# Patient Record
Sex: Female | Born: 1945 | Race: White | Hispanic: No | Smoking: Never smoker
Health system: Southern US, Community
[De-identification: ages and names within clinical notes are randomized; demographics above are authoritative.]

## PROBLEM LIST (undated history)

## (undated) DIAGNOSIS — M8580 Other specified disorders of bone density and structure, unspecified site: Secondary | ICD-10-CM

## (undated) DIAGNOSIS — Z780 Asymptomatic menopausal state: Secondary | ICD-10-CM

## (undated) DIAGNOSIS — E119 Type 2 diabetes mellitus without complications: Secondary | ICD-10-CM

## (undated) HISTORY — DX: Asymptomatic menopausal state: Z78.0

---

## 2008-04-20 ENCOUNTER — Ambulatory Visit: Payer: Self-pay | Admitting: Urology

## 2008-05-31 ENCOUNTER — Ambulatory Visit: Payer: Self-pay | Admitting: Unknown Physician Specialty

## 2009-02-22 ENCOUNTER — Inpatient Hospital Stay: Payer: Self-pay | Admitting: Unknown Physician Specialty

## 2009-06-14 ENCOUNTER — Ambulatory Visit: Payer: Self-pay | Admitting: Endocrinology

## 2009-11-15 IMAGING — CT CT OF THE RIGHT ANKLE WITHOUT CONTRAST
1 series · 12 of 14 positions shown, 15 images · non-contrast
Comparison: none

REASON FOR EXAM: fracture. with 3d recon
COMMENTS:

[Series 3: axial · axial · 0.39mm/px · z∈[-420,-282]mm · 12 of 56 slices shown, 15 images]
[im 5/56  soft-tissue]
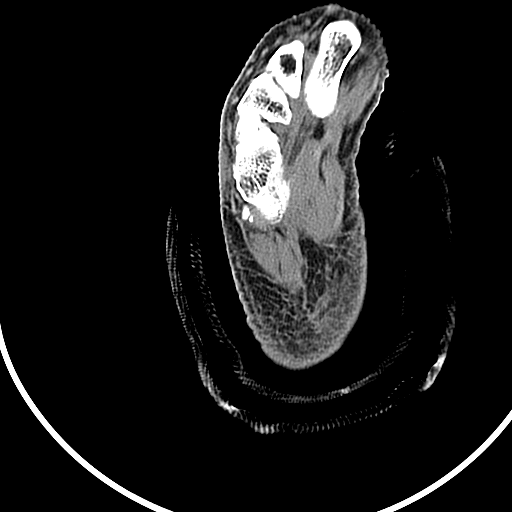
[im 5/56  bone]
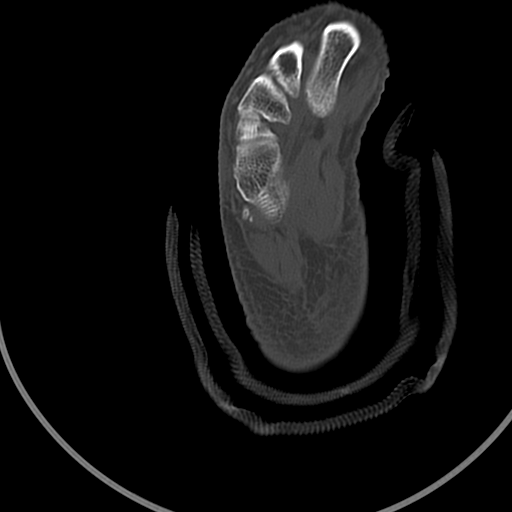
[im 9/56  bone]
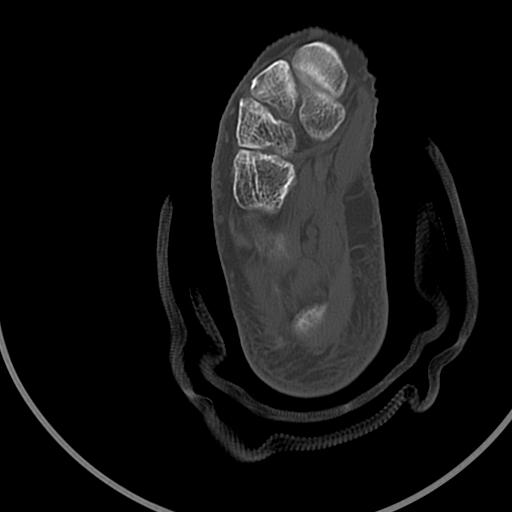
[im 13/56  bone]
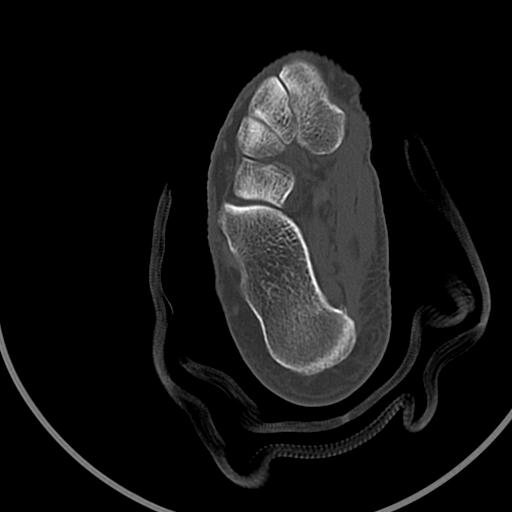
[im 17/56  bone]
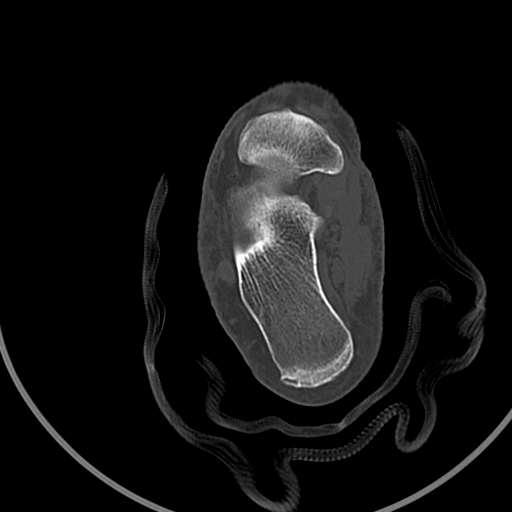
[im 22/56  soft-tissue]
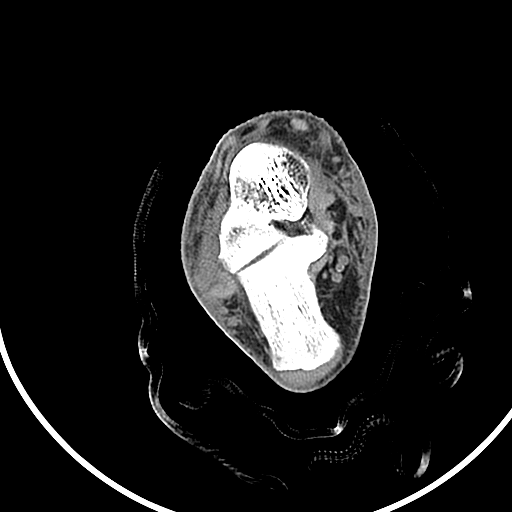
[im 22/56  bone]
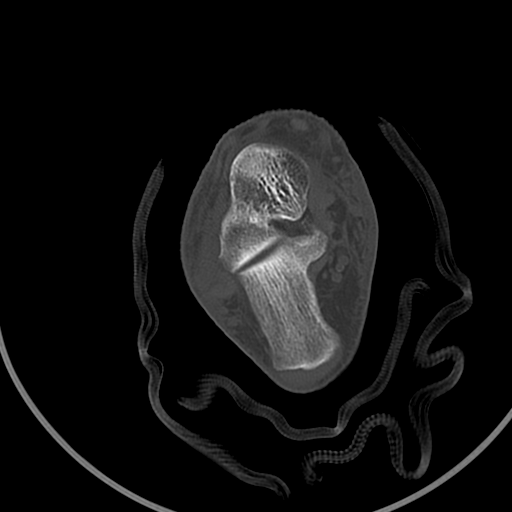
[im 26/56  bone]
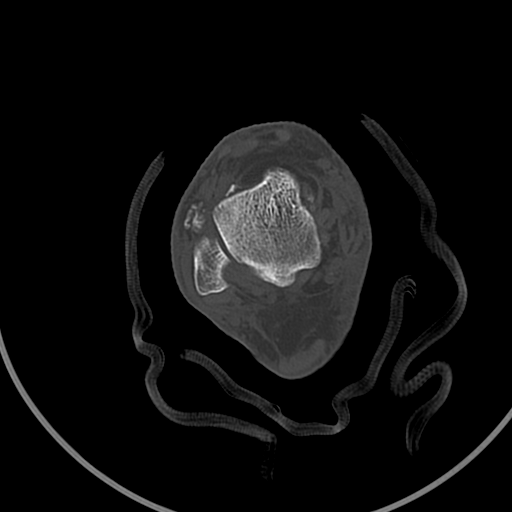
[im 30/56  bone]
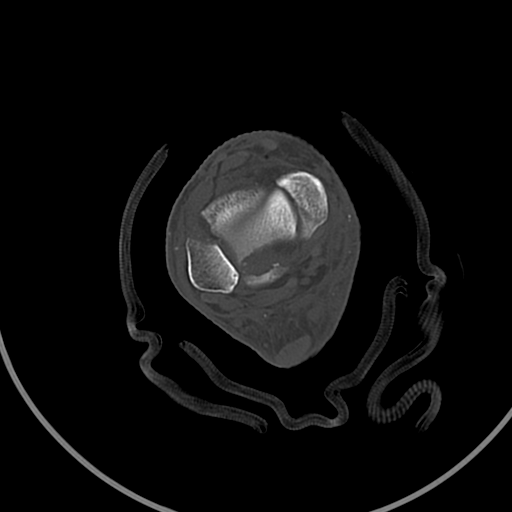
[im 34/56  bone]
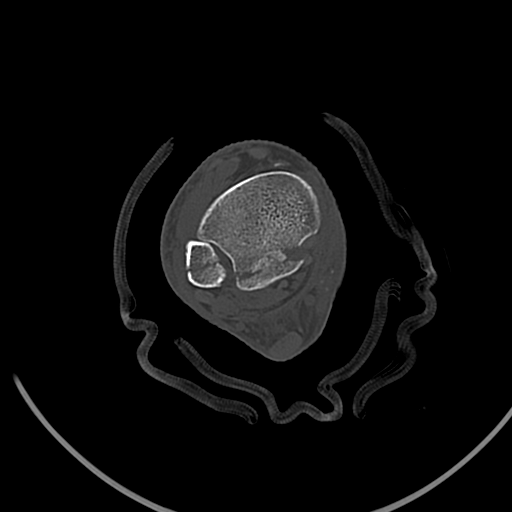
[im 39/56  soft-tissue]
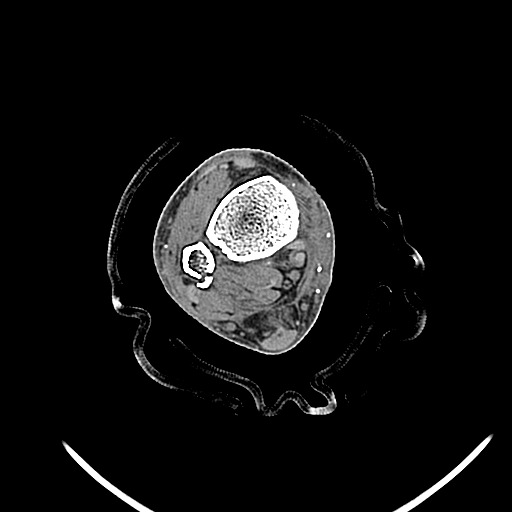
[im 39/56  bone]
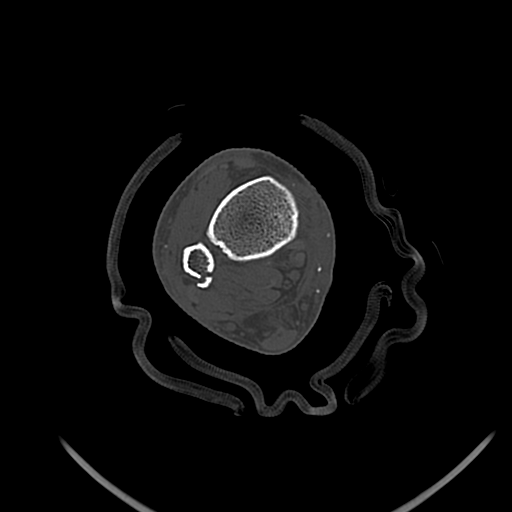
[im 43/56  bone]
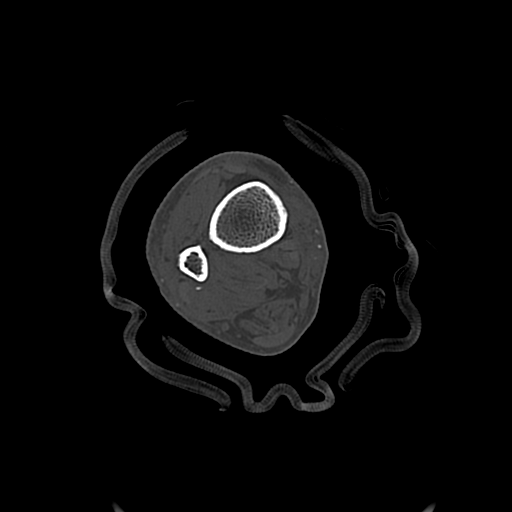
[im 47/56  bone]
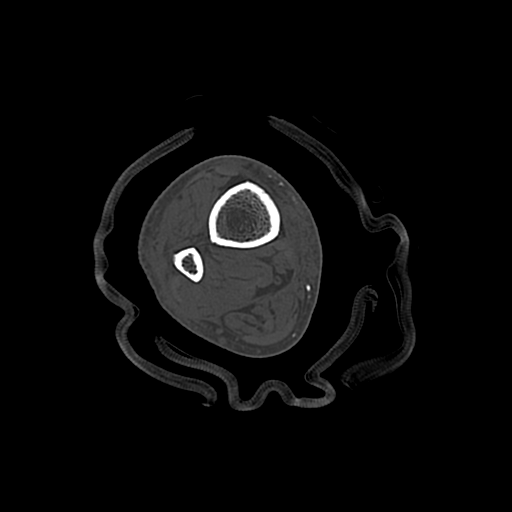
[im 51/56  bone]
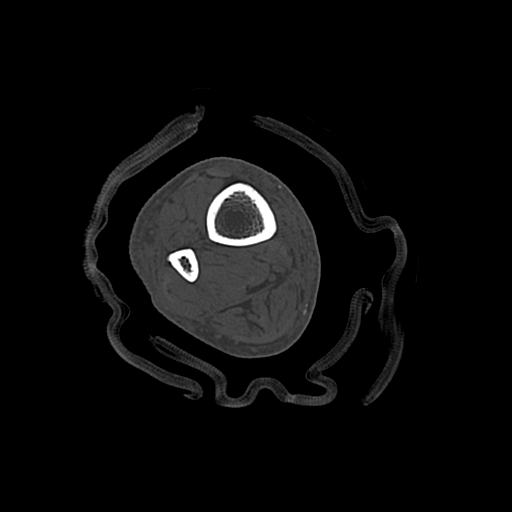

[12 of 14 positions shown; findings below may reference images not displayed]

PROCEDURE:     CT  - CT ANKLE RIGHT WO  - February 22, 2009  [DATE]

RESULT:     Axial, coronal and sagittal imaging of the RIGHT ankle was
obtained as well as 3-D reconstructions. This was correlated with a previous
plain film ankle series dated 02-22-2009.

In the interim there has been realignment of the ankle mortise. A
comminuted, trimalleolar fracture is identified. A focal fragment is
appreciated along the anterior aspect of the lateral malleolus. As stated
above the ankle mortise appears to be intact consistent with realignment
status post initial injury. A small osseous fragment is identified in the
intraarticular location along the lateral aspect of the talar dome and
medial malleolar articulation. This is appreciated on image #33.  A second
small intraarticular fragment is identified along the distal aspect of the
tibiofibular joint posteriorly.
IMPRESSION: 1. Comminuted, trimalleolar fracture as described above.

2. The ankle mortise appears to be intact.

3. Findings concerning for very small areas of interarticular osseous
fragments as described above.

## 2010-02-12 ENCOUNTER — Ambulatory Visit: Payer: Self-pay | Admitting: Endocrinology

## 2014-12-20 DIAGNOSIS — E876 Hypokalemia: Secondary | ICD-10-CM | POA: Diagnosis not present

## 2014-12-20 DIAGNOSIS — E063 Autoimmune thyroiditis: Secondary | ICD-10-CM | POA: Diagnosis not present

## 2014-12-20 DIAGNOSIS — I10 Essential (primary) hypertension: Secondary | ICD-10-CM | POA: Diagnosis not present

## 2014-12-20 DIAGNOSIS — E1169 Type 2 diabetes mellitus with other specified complication: Secondary | ICD-10-CM | POA: Diagnosis not present

## 2014-12-20 DIAGNOSIS — E780 Pure hypercholesterolemia: Secondary | ICD-10-CM | POA: Diagnosis not present

## 2014-12-26 DIAGNOSIS — I10 Essential (primary) hypertension: Secondary | ICD-10-CM | POA: Diagnosis not present

## 2014-12-26 DIAGNOSIS — E1169 Type 2 diabetes mellitus with other specified complication: Secondary | ICD-10-CM | POA: Diagnosis not present

## 2014-12-26 DIAGNOSIS — E039 Hypothyroidism, unspecified: Secondary | ICD-10-CM | POA: Diagnosis not present

## 2015-01-23 DIAGNOSIS — E039 Hypothyroidism, unspecified: Secondary | ICD-10-CM | POA: Diagnosis not present

## 2015-01-23 DIAGNOSIS — I10 Essential (primary) hypertension: Secondary | ICD-10-CM | POA: Diagnosis not present

## 2015-01-23 DIAGNOSIS — M1000 Idiopathic gout, unspecified site: Secondary | ICD-10-CM | POA: Diagnosis not present

## 2015-01-23 DIAGNOSIS — E1169 Type 2 diabetes mellitus with other specified complication: Secondary | ICD-10-CM | POA: Diagnosis not present

## 2015-01-23 DIAGNOSIS — R9431 Abnormal electrocardiogram [ECG] [EKG]: Secondary | ICD-10-CM | POA: Diagnosis not present

## 2015-01-29 DIAGNOSIS — E780 Pure hypercholesterolemia: Secondary | ICD-10-CM | POA: Diagnosis not present

## 2015-01-29 DIAGNOSIS — Z0000 Encounter for general adult medical examination without abnormal findings: Secondary | ICD-10-CM | POA: Diagnosis not present

## 2015-01-29 DIAGNOSIS — Z1211 Encounter for screening for malignant neoplasm of colon: Secondary | ICD-10-CM | POA: Diagnosis not present

## 2015-01-30 DIAGNOSIS — E039 Hypothyroidism, unspecified: Secondary | ICD-10-CM | POA: Diagnosis not present

## 2015-01-30 DIAGNOSIS — I10 Essential (primary) hypertension: Secondary | ICD-10-CM | POA: Diagnosis not present

## 2015-01-30 DIAGNOSIS — E1169 Type 2 diabetes mellitus with other specified complication: Secondary | ICD-10-CM | POA: Diagnosis not present

## 2015-01-30 DIAGNOSIS — R9431 Abnormal electrocardiogram [ECG] [EKG]: Secondary | ICD-10-CM | POA: Diagnosis not present

## 2015-02-19 DIAGNOSIS — I129 Hypertensive chronic kidney disease with stage 1 through stage 4 chronic kidney disease, or unspecified chronic kidney disease: Secondary | ICD-10-CM | POA: Diagnosis not present

## 2015-02-19 DIAGNOSIS — E119 Type 2 diabetes mellitus without complications: Secondary | ICD-10-CM | POA: Diagnosis not present

## 2015-02-19 DIAGNOSIS — N183 Chronic kidney disease, stage 3 (moderate): Secondary | ICD-10-CM | POA: Diagnosis not present

## 2015-02-19 DIAGNOSIS — R312 Other microscopic hematuria: Secondary | ICD-10-CM | POA: Diagnosis not present

## 2015-03-12 DIAGNOSIS — R609 Edema, unspecified: Secondary | ICD-10-CM | POA: Diagnosis not present

## 2015-03-12 DIAGNOSIS — I10 Essential (primary) hypertension: Secondary | ICD-10-CM | POA: Diagnosis not present

## 2015-03-12 DIAGNOSIS — N183 Chronic kidney disease, stage 3 (moderate): Secondary | ICD-10-CM | POA: Diagnosis not present

## 2015-03-12 DIAGNOSIS — E1129 Type 2 diabetes mellitus with other diabetic kidney complication: Secondary | ICD-10-CM | POA: Diagnosis not present

## 2015-03-21 DIAGNOSIS — E039 Hypothyroidism, unspecified: Secondary | ICD-10-CM | POA: Diagnosis not present

## 2015-03-21 DIAGNOSIS — E063 Autoimmune thyroiditis: Secondary | ICD-10-CM | POA: Diagnosis not present

## 2015-03-21 DIAGNOSIS — E1169 Type 2 diabetes mellitus with other specified complication: Secondary | ICD-10-CM | POA: Diagnosis not present

## 2015-03-28 DIAGNOSIS — Z1231 Encounter for screening mammogram for malignant neoplasm of breast: Secondary | ICD-10-CM | POA: Diagnosis not present

## 2015-04-03 DIAGNOSIS — I10 Essential (primary) hypertension: Secondary | ICD-10-CM | POA: Diagnosis not present

## 2015-04-03 DIAGNOSIS — E039 Hypothyroidism, unspecified: Secondary | ICD-10-CM | POA: Diagnosis not present

## 2015-04-03 DIAGNOSIS — E1169 Type 2 diabetes mellitus with other specified complication: Secondary | ICD-10-CM | POA: Diagnosis not present

## 2015-06-27 DIAGNOSIS — E780 Pure hypercholesterolemia: Secondary | ICD-10-CM | POA: Diagnosis not present

## 2015-06-27 DIAGNOSIS — E1169 Type 2 diabetes mellitus with other specified complication: Secondary | ICD-10-CM | POA: Diagnosis not present

## 2015-06-27 DIAGNOSIS — M1000 Idiopathic gout, unspecified site: Secondary | ICD-10-CM | POA: Diagnosis not present

## 2015-06-27 DIAGNOSIS — E039 Hypothyroidism, unspecified: Secondary | ICD-10-CM | POA: Diagnosis not present

## 2015-06-27 DIAGNOSIS — I10 Essential (primary) hypertension: Secondary | ICD-10-CM | POA: Diagnosis not present

## 2015-06-29 DIAGNOSIS — E1169 Type 2 diabetes mellitus with other specified complication: Secondary | ICD-10-CM | POA: Diagnosis not present

## 2015-06-29 DIAGNOSIS — E039 Hypothyroidism, unspecified: Secondary | ICD-10-CM | POA: Diagnosis not present

## 2015-06-29 DIAGNOSIS — I10 Essential (primary) hypertension: Secondary | ICD-10-CM | POA: Diagnosis not present

## 2015-06-29 DIAGNOSIS — M1000 Idiopathic gout, unspecified site: Secondary | ICD-10-CM | POA: Diagnosis not present

## 2015-06-29 DIAGNOSIS — M25562 Pain in left knee: Secondary | ICD-10-CM | POA: Diagnosis not present

## 2015-07-09 DIAGNOSIS — E1169 Type 2 diabetes mellitus with other specified complication: Secondary | ICD-10-CM | POA: Diagnosis not present

## 2015-07-09 DIAGNOSIS — I10 Essential (primary) hypertension: Secondary | ICD-10-CM | POA: Diagnosis not present

## 2015-07-09 DIAGNOSIS — M25462 Effusion, left knee: Secondary | ICD-10-CM | POA: Diagnosis not present

## 2015-07-09 DIAGNOSIS — E780 Pure hypercholesterolemia: Secondary | ICD-10-CM | POA: Diagnosis not present

## 2015-07-26 DIAGNOSIS — E039 Hypothyroidism, unspecified: Secondary | ICD-10-CM | POA: Diagnosis not present

## 2015-07-26 DIAGNOSIS — M25562 Pain in left knee: Secondary | ICD-10-CM | POA: Diagnosis not present

## 2015-07-26 DIAGNOSIS — I10 Essential (primary) hypertension: Secondary | ICD-10-CM | POA: Diagnosis not present

## 2015-09-05 DIAGNOSIS — E039 Hypothyroidism, unspecified: Secondary | ICD-10-CM | POA: Diagnosis not present

## 2015-09-05 DIAGNOSIS — I10 Essential (primary) hypertension: Secondary | ICD-10-CM | POA: Diagnosis not present

## 2015-09-05 DIAGNOSIS — Z23 Encounter for immunization: Secondary | ICD-10-CM | POA: Diagnosis not present

## 2015-09-05 DIAGNOSIS — M25462 Effusion, left knee: Secondary | ICD-10-CM | POA: Diagnosis not present

## 2015-09-05 DIAGNOSIS — E7800 Pure hypercholesterolemia, unspecified: Secondary | ICD-10-CM | POA: Diagnosis not present

## 2015-09-05 DIAGNOSIS — E1169 Type 2 diabetes mellitus with other specified complication: Secondary | ICD-10-CM | POA: Diagnosis not present

## 2015-11-30 DIAGNOSIS — E039 Hypothyroidism, unspecified: Secondary | ICD-10-CM | POA: Diagnosis not present

## 2015-11-30 DIAGNOSIS — J0111 Acute recurrent frontal sinusitis: Secondary | ICD-10-CM | POA: Diagnosis not present

## 2015-11-30 DIAGNOSIS — E1169 Type 2 diabetes mellitus with other specified complication: Secondary | ICD-10-CM | POA: Diagnosis not present

## 2015-11-30 DIAGNOSIS — I10 Essential (primary) hypertension: Secondary | ICD-10-CM | POA: Diagnosis not present

## 2015-12-03 DIAGNOSIS — E1169 Type 2 diabetes mellitus with other specified complication: Secondary | ICD-10-CM | POA: Diagnosis not present

## 2015-12-03 DIAGNOSIS — E039 Hypothyroidism, unspecified: Secondary | ICD-10-CM | POA: Diagnosis not present

## 2015-12-03 DIAGNOSIS — E876 Hypokalemia: Secondary | ICD-10-CM | POA: Diagnosis not present

## 2015-12-03 DIAGNOSIS — E785 Hyperlipidemia, unspecified: Secondary | ICD-10-CM | POA: Diagnosis not present

## 2015-12-06 DIAGNOSIS — I10 Essential (primary) hypertension: Secondary | ICD-10-CM | POA: Diagnosis not present

## 2015-12-06 DIAGNOSIS — E039 Hypothyroidism, unspecified: Secondary | ICD-10-CM | POA: Diagnosis not present

## 2015-12-06 DIAGNOSIS — E1169 Type 2 diabetes mellitus with other specified complication: Secondary | ICD-10-CM | POA: Diagnosis not present

## 2015-12-06 DIAGNOSIS — E063 Autoimmune thyroiditis: Secondary | ICD-10-CM | POA: Diagnosis not present

## 2016-01-16 DIAGNOSIS — H2513 Age-related nuclear cataract, bilateral: Secondary | ICD-10-CM | POA: Diagnosis not present

## 2016-01-16 DIAGNOSIS — E119 Type 2 diabetes mellitus without complications: Secondary | ICD-10-CM | POA: Diagnosis not present

## 2016-01-31 DIAGNOSIS — Z0000 Encounter for general adult medical examination without abnormal findings: Secondary | ICD-10-CM | POA: Diagnosis not present

## 2016-01-31 DIAGNOSIS — Z1211 Encounter for screening for malignant neoplasm of colon: Secondary | ICD-10-CM | POA: Diagnosis not present

## 2016-01-31 DIAGNOSIS — I10 Essential (primary) hypertension: Secondary | ICD-10-CM | POA: Diagnosis not present

## 2016-03-03 DIAGNOSIS — E1169 Type 2 diabetes mellitus with other specified complication: Secondary | ICD-10-CM | POA: Diagnosis not present

## 2016-03-03 DIAGNOSIS — E785 Hyperlipidemia, unspecified: Secondary | ICD-10-CM | POA: Diagnosis not present

## 2016-03-06 DIAGNOSIS — E039 Hypothyroidism, unspecified: Secondary | ICD-10-CM | POA: Diagnosis not present

## 2016-03-06 DIAGNOSIS — M1000 Idiopathic gout, unspecified site: Secondary | ICD-10-CM | POA: Diagnosis not present

## 2016-03-06 DIAGNOSIS — I10 Essential (primary) hypertension: Secondary | ICD-10-CM | POA: Diagnosis not present

## 2016-03-06 DIAGNOSIS — E1165 Type 2 diabetes mellitus with hyperglycemia: Secondary | ICD-10-CM | POA: Diagnosis not present

## 2016-03-18 DIAGNOSIS — R319 Hematuria, unspecified: Secondary | ICD-10-CM | POA: Diagnosis not present

## 2016-03-18 DIAGNOSIS — I10 Essential (primary) hypertension: Secondary | ICD-10-CM | POA: Diagnosis not present

## 2016-03-18 DIAGNOSIS — E1129 Type 2 diabetes mellitus with other diabetic kidney complication: Secondary | ICD-10-CM | POA: Diagnosis not present

## 2016-03-18 DIAGNOSIS — N183 Chronic kidney disease, stage 3 (moderate): Secondary | ICD-10-CM | POA: Diagnosis not present

## 2016-03-24 DIAGNOSIS — E1129 Type 2 diabetes mellitus with other diabetic kidney complication: Secondary | ICD-10-CM | POA: Diagnosis not present

## 2016-03-24 DIAGNOSIS — R600 Localized edema: Secondary | ICD-10-CM | POA: Diagnosis not present

## 2016-03-24 DIAGNOSIS — N183 Chronic kidney disease, stage 3 (moderate): Secondary | ICD-10-CM | POA: Diagnosis not present

## 2016-03-31 DIAGNOSIS — Z1231 Encounter for screening mammogram for malignant neoplasm of breast: Secondary | ICD-10-CM | POA: Diagnosis not present

## 2016-06-06 DIAGNOSIS — E039 Hypothyroidism, unspecified: Secondary | ICD-10-CM | POA: Diagnosis not present

## 2016-06-06 DIAGNOSIS — I10 Essential (primary) hypertension: Secondary | ICD-10-CM | POA: Diagnosis not present

## 2016-06-06 DIAGNOSIS — E1165 Type 2 diabetes mellitus with hyperglycemia: Secondary | ICD-10-CM | POA: Diagnosis not present

## 2016-06-06 DIAGNOSIS — E7800 Pure hypercholesterolemia, unspecified: Secondary | ICD-10-CM | POA: Diagnosis not present

## 2016-06-06 DIAGNOSIS — M1000 Idiopathic gout, unspecified site: Secondary | ICD-10-CM | POA: Diagnosis not present

## 2016-09-08 DIAGNOSIS — Z23 Encounter for immunization: Secondary | ICD-10-CM | POA: Diagnosis not present

## 2016-09-08 DIAGNOSIS — E039 Hypothyroidism, unspecified: Secondary | ICD-10-CM | POA: Diagnosis not present

## 2016-09-08 DIAGNOSIS — E1169 Type 2 diabetes mellitus with other specified complication: Secondary | ICD-10-CM | POA: Diagnosis not present

## 2016-09-08 DIAGNOSIS — M1000 Idiopathic gout, unspecified site: Secondary | ICD-10-CM | POA: Diagnosis not present

## 2016-09-08 DIAGNOSIS — E1165 Type 2 diabetes mellitus with hyperglycemia: Secondary | ICD-10-CM | POA: Diagnosis not present

## 2020-02-24 ENCOUNTER — Other Ambulatory Visit: Payer: Self-pay

## 2020-02-24 ENCOUNTER — Ambulatory Visit: Payer: Medicare Other | Attending: Internal Medicine

## 2020-02-24 DIAGNOSIS — Z23 Encounter for immunization: Secondary | ICD-10-CM

## 2020-02-24 NOTE — Progress Notes (Signed)
   Covid-19 Vaccination Clinic  Name:  Pamela Benson    MRN: [id number] DOB: 1946/07/09  02/24/2020  [name] was observed post Covid-19 immunization for 15 minutes without incident. She was provided with Vaccine Information Sheet and instruction to access the V-Safe system.   Ms. Grams was instructed to call 911 with any severe reactions post vaccine: Marland Kitchen Difficulty breathing  . Swelling of face and throat  . A fast heartbeat  . A bad rash all over body  . Dizziness and weakness   Immunizations Administered    Name Date Dose VIS Date Route   Pfizer COVID-19 Vaccine 02/24/2020 10:54 AM 0.3 mL 10/28/2019 Intramuscular   Manufacturer: [employer], [employer]   Lot: [id number]   NDC: [address]-6

## 2020-03-21 ENCOUNTER — Ambulatory Visit: Payer: Medicare Other | Attending: Internal Medicine

## 2020-03-21 ENCOUNTER — Other Ambulatory Visit: Payer: Self-pay

## 2020-03-21 DIAGNOSIS — Z23 Encounter for immunization: Secondary | ICD-10-CM

## 2020-03-21 NOTE — Progress Notes (Signed)
   Covid-19 Vaccination Clinic  Name:  Pamela Benson    MRN: [id number] DOB: September 05, 1946  03/21/2020  [name] was observed post Covid-19 immunization for 15 minutes without incident. She was provided with Vaccine Information Sheet and instruction to access the V-Safe system.   [name] was instructed to call 911 with any severe reactions post vaccine: Marland Kitchen Difficulty breathing  . Swelling of face and throat  . A fast heartbeat  . A bad rash all over body  . Dizziness and weakness   Immunizations Administered    Name Date Dose VIS Date Route   Pfizer COVID-19 Vaccine 03/21/2020 10:23 AM 0.3 mL 01/11/2019 Intramuscular   Manufacturer: [employer], [employer]   Lot: [id number]   NDC: [address]-7

## 2022-05-13 ENCOUNTER — Encounter: Payer: Self-pay | Admitting: *Deleted

## 2022-05-13 ENCOUNTER — Other Ambulatory Visit: Payer: Self-pay

## 2022-05-13 ENCOUNTER — Emergency Department
Admission: EM | Admit: 2022-05-13 | Discharge: 2022-05-13 | Disposition: A | Discharge status: Home / Self Care | Payer: Medicare Other | Attending: Emergency Medicine | Admitting: Emergency Medicine

## 2022-05-13 ENCOUNTER — Emergency Department: Payer: Medicare Other

## 2022-05-13 DIAGNOSIS — M79672 Pain in left foot: Secondary | ICD-10-CM | POA: Diagnosis present

## 2022-05-13 NOTE — Discharge Instructions (Signed)
You can take naproxen sparingly over the next 2 to 3 days Please keep foot elevated and apply ice at home.

## 2022-05-13 NOTE — ED Provider Notes (Signed)
[facility] Provider Note  Patient Contact: 8:50 PM (approximate)   History   Ankle Pain   HPI  Pamela Benson is a 76 y.o. female presents to the emergency department after patient felt a pop in her left foot while going from a sitting to a standing position.  She denies current pain.  No difficulty ambulating.  No erythema.  No other alleviating measures have been attempted.      Physical Exam   Triage Vital Signs: ED Triage Vitals  Enc Vitals Group     BP 05/13/22 1914 (!) 139/91     Pulse Rate 05/13/22 1914 76     Resp 05/13/22 1914 20     Temp 05/13/22 1914 98.3 F (36.8 C)     Temp Source 05/13/22 1914 Oral     SpO2 05/13/22 1914 97 %     Weight 05/13/22 1912 233 lb (105.7 kg)     Height 05/13/22 1912 5\' 5"  (1.651 m)     Head Circumference --      Peak Flow --      Pain Score 05/13/22 1912 0     Pain Loc --      Pain Edu? --      Excl. in GC? --     Most recent vital signs: Vitals:   05/13/22 1914  BP: (!) 139/91  Pulse: 76  Resp: 20  Temp: 98.3 F (36.8 C)  SpO2: 97%     General: Alert and in no acute distress. Eyes:  PERRL. EOMI. Head: No acute traumatic findings ENT:      Nose: No congestion/rhinnorhea.      Mouth/Throat: Mucous membranes are moist. Neck: No stridor. No cervical spine tenderness to palpation. Cardiovascular:  Good peripheral perfusion Respiratory: Normal respiratory effort without tachypnea or retractions. Lungs CTAB. Good air entry to the bases with no decreased or absent breath sounds. Gastrointestinal: Bowel sounds 4 quadrants. Soft and nontender to palpation. No guarding or rigidity. No palpable masses. No distention. No CVA tenderness. Musculoskeletal: Full range of motion to all extremities.  No tenderness to palpation over the anterior and posterior talofibular ligaments.  Palpable dorsalis pedis pulse, left.  Capillary refill less than 2 seconds on the left. Neurologic:  No gross focal  neurologic deficits are appreciated.  Skin:   No rash noted   ED Results / Procedures / Treatments   Labs (all labs ordered are listed, but only abnormal results are displayed) Labs Reviewed - No data to display    RADIOLOGY  I personally viewed and evaluated these images as part of my medical decision making, as well as reviewing the written report by the radiologist.  ED Provider Interpretation: I personally interpreted x-ray of left foot and there was no acute bony abnormality visualized.   PROCEDURES:  Critical Care performed: No  Procedures   MEDICATIONS ORDERED IN ED: Medications - No data to display   IMPRESSION / MDM / ASSESSMENT AND PLAN / ED COURSE  I reviewed the triage vital signs and the nursing notes.                             Assessment and plan Left foot pain 76 year old female presents to the emergency department after feeling a pop in her left foot.  She reports benign pain there was no acute bony abnormality visualized on her x-ray of her left foot.  Recommended RICE, ice, compression elevation.  Patient was advised to use naproxen sparingly over the next several days after she denied prior history of gastritis or GI bleed.      FINAL CLINICAL IMPRESSION(S) / ED DIAGNOSES   Final diagnoses:  Left foot pain     Rx / DC Orders   ED Discharge Orders     None        Note:  This document was prepared using Dragon voice recognition software and may include unintentional dictation errors.   Pia Mau [place], PA-C 05/13/22 2052    Merwyn Katos, MD 05/13/22 [phone]

## 2023-05-21 ENCOUNTER — Inpatient Hospital Stay
Admission: AD | Admit: 2023-05-21 | Discharge: 2023-05-26 | DRG: 885 | Disposition: A | Discharge status: Home / Self Care | Payer: Medicare Other | Source: Intra-hospital | Attending: Psychiatry | Admitting: Psychiatry

## 2023-05-21 ENCOUNTER — Emergency Department (HOSPITAL_COMMUNITY)
Admission: EM | Admit: 2023-05-21 | Discharge: 2023-05-21 | Disposition: A | Discharge status: Other Acute Inpatient Hospital | Payer: Medicare Other | Source: Home / Self Care | Attending: Student in an Organized Health Care Education/Training Program | Admitting: Student in an Organized Health Care Education/Training Program

## 2023-05-21 ENCOUNTER — Encounter: Payer: Self-pay | Admitting: Psychiatry

## 2023-05-21 ENCOUNTER — Other Ambulatory Visit: Payer: Self-pay

## 2023-05-21 DIAGNOSIS — R45851 Suicidal ideations: Secondary | ICD-10-CM | POA: Diagnosis present

## 2023-05-21 DIAGNOSIS — Z79899 Other long term (current) drug therapy: Secondary | ICD-10-CM

## 2023-05-21 DIAGNOSIS — R45850 Homicidal ideations: Secondary | ICD-10-CM | POA: Diagnosis present

## 2023-05-21 DIAGNOSIS — Z20822 Contact with and (suspected) exposure to covid-19: Secondary | ICD-10-CM | POA: Insufficient documentation

## 2023-05-21 DIAGNOSIS — F419 Anxiety disorder, unspecified: Secondary | ICD-10-CM | POA: Diagnosis present

## 2023-05-21 DIAGNOSIS — Z79890 Hormone replacement therapy (postmenopausal): Secondary | ICD-10-CM | POA: Diagnosis not present

## 2023-05-21 DIAGNOSIS — F23 Brief psychotic disorder: Principal | ICD-10-CM | POA: Diagnosis present

## 2023-05-21 DIAGNOSIS — Z733 Stress, not elsewhere classified: Secondary | ICD-10-CM | POA: Insufficient documentation

## 2023-05-21 DIAGNOSIS — F29 Unspecified psychosis not due to a substance or known physiological condition: Secondary | ICD-10-CM | POA: Insufficient documentation

## 2023-05-21 DIAGNOSIS — Z7983 Long term (current) use of bisphosphonates: Secondary | ICD-10-CM | POA: Diagnosis not present

## 2023-05-21 DIAGNOSIS — F22 Delusional disorders: Secondary | ICD-10-CM | POA: Insufficient documentation

## 2023-05-21 LAB — CBC WITH DIFFERENTIAL/PLATELET
Abs Immature Granulocytes: 0.04 10*3/uL (ref 0.00–0.07)
Basophils Absolute: 0.1 10*3/uL (ref 0.0–0.1)
Basophils Relative: 1 %
Eosinophils Absolute: 0.1 10*3/uL (ref 0.0–0.5)
Eosinophils Relative: 1 %
HCT: 38.3 % (ref 36.0–46.0)
Hemoglobin: 12 g/dL (ref 12.0–15.0)
Immature Granulocytes: 0 %
Lymphocytes Relative: 14 %
Lymphs Abs: 1.4 10*3/uL (ref 0.7–4.0)
MCH: 28.1 pg (ref 26.0–34.0)
MCHC: 31.3 g/dL (ref 30.0–36.0)
MCV: 89.7 fL (ref 80.0–100.0)
Monocytes Absolute: 0.6 10*3/uL (ref 0.1–1.0)
Monocytes Relative: 6 %
Neutro Abs: 7.6 10*3/uL (ref 1.7–7.7)
Neutrophils Relative %: 78 %
Platelets: 290 10*3/uL (ref 150–400)
RBC: 4.27 MIL/uL (ref 3.87–5.11)
RDW: 15.7 % — ABNORMAL HIGH (ref 11.5–15.5)
WBC: 9.8 10*3/uL (ref 4.0–10.5)
nRBC: 0 % (ref 0.0–0.2)

## 2023-05-21 LAB — BASIC METABOLIC PANEL
Anion gap: 8 (ref 5–15)
BUN: 19 mg/dL (ref 8–23)
CO2: 27 mmol/L (ref 22–32)
Calcium: 9.3 mg/dL (ref 8.9–10.3)
Chloride: 102 mmol/L (ref 98–111)
Creatinine, Ser: 1.2 mg/dL — ABNORMAL HIGH (ref 0.44–1.00)
GFR, Estimated: 47 mL/min — ABNORMAL LOW (ref >60)
Glucose, Bld: 158 mg/dL — ABNORMAL HIGH (ref 70–99)
Potassium: 3.7 mmol/L (ref 3.5–5.1)
Sodium: 137 mmol/L (ref 135–145)

## 2023-05-21 LAB — URINALYSIS, ROUTINE W REFLEX MICROSCOPIC
Bilirubin Urine: NEGATIVE
Glucose, UA: 50 mg/dL — AB
Ketones, ur: NEGATIVE mg/dL
Nitrite: NEGATIVE
Protein, ur: NEGATIVE mg/dL
Specific Gravity, Urine: 1.015 (ref 1.005–1.030)
pH: 6 (ref 5.0–8.0)

## 2023-05-21 LAB — SARS CORONAVIRUS 2 BY RT PCR: SARS Coronavirus 2 by RT PCR: NEGATIVE

## 2023-05-21 MED ORDER — OLANZAPINE 10 MG IM SOLR: 5.0000 mg | Freq: Every day | INTRAMUSCULAR | Status: DC | PRN

## 2023-05-21 MED ORDER — RISPERIDONE 1 MG PO TABS
0.5000 mg | ORAL_TABLET | Freq: Two times a day (BID) | ORAL | Status: DC
  Administered 2023-05-21 – 2023-05-26 (×10): 0.5 mg via ORAL
  Filled 2023-05-21 (×11): qty 1

## 2023-05-21 MED ORDER — ALUM & MAG HYDROXIDE-SIMETH 200-200-20 MG/5ML PO SUSP: 30.0000 mL | ORAL | Status: DC | PRN

## 2023-05-21 MED ORDER — OLANZAPINE 5 MG PO TABS: 5.0000 mg | ORAL_TABLET | Freq: Every day | ORAL | Status: DC | PRN

## 2023-05-21 MED ORDER — CEPHALEXIN 500 MG PO CAPS
500.0000 mg | ORAL_CAPSULE | Freq: Once | ORAL | Status: AC
  Administered 2023-05-21: 500 mg via ORAL
  Filled 2023-05-21: qty 1

## 2023-05-21 MED ORDER — MAGNESIUM HYDROXIDE 400 MG/5ML PO SUSP: 30.0000 mL | Freq: Every day | ORAL | Status: DC | PRN

## 2023-05-21 MED ORDER — ACETAMINOPHEN 325 MG PO TABS: 650.0000 mg | ORAL_TABLET | Freq: Four times a day (QID) | ORAL | Status: DC | PRN

## 2023-05-21 MED ORDER — RISPERIDONE 1 MG PO TABS
0.5000 mg | ORAL_TABLET | Freq: Two times a day (BID) | ORAL | Status: DC
  Administered 2023-05-21: 0.5 mg via ORAL
  Filled 2023-05-21: qty 1

## 2023-05-21 NOTE — ED Provider Notes (Signed)
[facility] Provider Note    Event Date/Time   First MD Initiated Contact with Patient 05/21/23 1235     (approximate)   History   Stress and Hallucinations (Patient is here today with her sister with concerns over stress and anxiety related to problems in her neighborhood; She states that she has not slept well in "months" and sleeps at most 4 hours a night; She also reports AH of people chanting and someone saying "I'm gonna kill you."; She denies SI/HI; She was recently started on Buspar by her PCP but has had no relief with it)   HPI  Pamela Benson is a 77 y.o. female no significant past medical history presents to the ER for evaluation of increasing anxiety and stress and reporting having auditory hallucinations over the past several weeks where she feels like someone is walking around her house saying "I will kill you.  "She is called police multiple times.  Family have stated her house to ensure that there are no people that could be sameness or making noises and that has been confirmed.  She was recently prescribed buspirone 10 mg twice daily by her PCP and she just started taking this.  Denies any other medications.  No history of dementia.     Physical Exam   Triage Vital Signs: ED Triage Vitals  Enc Vitals Group     BP 05/21/23 1153 (!) 144/89     Pulse Rate 05/21/23 1153 70     Resp 05/21/23 1153 19     Temp 05/21/23 1153 98.6 F (37 C)     Temp Source 05/21/23 1153 Oral     SpO2 05/21/23 1153 98 %     Weight 05/21/23 1150 237 lb (107.5 kg)     Height 05/21/23 1150 5\' 4"  (1.626 m)     Head Circumference --      Peak Flow --      Pain Score 05/21/23 1151 0     Pain Loc --      Pain Edu? --      Excl. in GC? --     Most recent vital signs: Vitals:   05/21/23 1153  BP: (!) 144/89  Pulse: 70  Resp: 19  Temp: 98.6 F (37 C)  SpO2: 98%     Constitutional: Alert  Eyes: Conjunctivae are normal.  Head: Atraumatic. Nose: No  congestion/rhinnorhea. Mouth/Throat: Mucous membranes are moist.   Neck: Painless ROM.  Cardiovascular:   Good peripheral circulation. Respiratory: Normal respiratory effort.  No retractions.  Gastrointestinal: Soft and nontender.  Musculoskeletal:  no deformity Neurologic:  MAE spontaneously. No gross focal neurologic deficits are appreciated.  Skin:  Skin is warm, dry and intact. No rash noted. Psychiatric: Mood and affect are normal. Speech and behavior are normal.    ED Results / Procedures / Treatments   Labs (all labs ordered are listed, but only abnormal results are displayed) Labs Reviewed  CBC WITH DIFFERENTIAL/PLATELET - Abnormal; Notable for the following components:      Result Value   RDW 15.7 (*)    All other components within normal limits  BASIC METABOLIC PANEL - Abnormal; Notable for the following components:   Glucose, Bld 158 (*)    Creatinine, Ser 1.20 (*)    GFR, Estimated 47 (*)    All other components within normal limits  URINALYSIS, ROUTINE W REFLEX MICROSCOPIC - Abnormal; Notable for the following components:   Color, Urine YELLOW (*)  APPearance HAZY (*)    Glucose, UA 50 (*)    Hgb urine dipstick SMALL (*)    Leukocytes,Ua SMALL (*)    Bacteria, UA MANY (*)    All other components within normal limits     EKG     RADIOLOGY    PROCEDURES:  Critical Care performed:   Procedures   MEDICATIONS ORDERED IN ED: Medications  risperiDONE (RISPERDAL) tablet 0.5 mg (has no administration in time range)  OLANZapine (ZYPREXA) tablet 5 mg (has no administration in time range)    Or  OLANZapine (ZYPREXA) injection 5 mg (has no administration in time range)  cephALEXin (KEFLEX) capsule 500 mg (500 mg Oral Given 05/21/23 1346)     IMPRESSION / MDM / ASSESSMENT AND PLAN / ED COURSE  I reviewed the triage vital signs and the nursing notes.                              Differential diagnosis includes, but is not limited to, Psychosis,  delirium, medication effect, noncompliance, polysubstance abuse, Si, Hi, depression  Patient presenting to the ER for evaluation of symptoms as described above.  Based on symptoms, risk factors and considered above differential, this presenting complaint could reflect a potentially life-threatening illness therefore the patient will be placed on continuous pulse oximetry and telemetry for monitoring.  Laboratory evaluation will be sent to evaluate for the above complaints.  Patient does have evidence of UTI but exhibiting findings of hallucinations and paranoia.  Psychiatry was consulted.  They are recommending Geri psychiatric admission.  Patient and family agreeable to plan.       FINAL CLINICAL IMPRESSION(S) / ED DIAGNOSES   Final diagnoses:  Paranoia (HCC)     Rx / DC Orders   ED Discharge Orders     None        Note:  This document was prepared using Dragon voice recognition software and may include unintentional dictation errors.    Willy Eddy, MD 05/21/23 [phone]

## 2023-05-21 NOTE — ED Notes (Signed)
Called report to [facility] Fuse to give report on patient at this time to Althea Charon, RN

## 2023-05-21 NOTE — ED Notes (Addendum)
Patient being dressed out at this time by this RN and Cailtin, NT.  Patient Belongings:  1 brown purse 1 cell phone 1 phone charger 1 grey ring 1 grey watch 2 grey earrings 1 multicolored shirt 1 pair of blue shorts 2 white socks 2 black shoes 1 white bra 1 pink cup 1 purple pair of underwear

## 2023-05-21 NOTE — Group Note (Unsigned)
Date:  05/21/2023 Time:  10:52 PM  Group Topic/Focus:  Crisis Planning:   The purpose of this group is to help patients create a crisis plan for use upon discharge or in the future, as needed. Goals Group:   The focus of this group is to help patients establish daily goals to achieve during treatment and discuss how the patient can incorporate goal setting into their daily lives to aide in recovery.     Participation Level:  {BHH PARTICIPATION ZOXWR:60454}  Participation Quality:  {BHH PARTICIPATION QUALITY:22265}  Affect:  {BHH AFFECT:22266}  Cognitive:  {BHH COGNITIVE:22267}  Insight: {BHH Insight2:20797}  Engagement in Group:  {BHH ENGAGEMENT IN UJWJX:91478}  Modes of Intervention:  {BHH MODES OF INTERVENTION:22269}  Additional Comments:  ***  Pamela Benson 05/21/2023, 10:52 PM

## 2023-05-21 NOTE — ED Triage Notes (Signed)
Patient is here today with her sister with concerns over stress and anxiety related to problems in her neighborhood; She states that she has not slept well in "months" and sleeps at most 4 hours a night; She also reports AH of people chanting and someone saying "I'm gonna kill you."; She denies SI/HI; She was recently started on Buspar by her PCP but has had no relief with it

## 2023-05-21 NOTE — ED Notes (Signed)
Clide Cliff, RN to give results of covid test.

## 2023-05-21 NOTE — ED Notes (Signed)
Report given to Hannah RN at this time.

## 2023-05-21 NOTE — Consult Note (Signed)
[facility] Face-to-Face Psychiatry Consult   Reason for Consult:  auditory hallucinations Referring Physician:  EDP Patient Identification: Pamela Benson MRN:  [id number] Principal Diagnosis: Acute psychosis [facility]) Diagnosis:  Principal Problem:   Acute psychosis (HCC)   Total Time spent with patient: 45 minutes  Subjective:   Pamela Benson is a 77 y.o. female patient admitted with psychosis.  HPI:   77 yo female presented to the ED for auditory hallucinations and delusions, no psych history.  On assessment, she requested her sister stay "we share everything" who interjected additional findings.  The client reported, "I've had a lot of stress and aggravation and a lot of things other people don't."  Since January, she started hearing people telling her they are after her.  Sometimes she hears a lady outside her door that sings a song about how she is after her.  She stated they come in her yard and hide in her woods, never sees them. These voices occur throughout the day, increase at night.  She fears "someone is going to kill me" all the time. This has resulted in her calling the police 73 times about this concerns.  Her nephew, a retired Emergency planning/management officer, placed cameras around her home that monitor activity and has shown no abnormal activities. Her sleep initiation is "good" but maintenance is poor related to the fear, anxiety, and voices.  Appetite is "good, I cook and everything".  She has lived in her home for 21 years and nothing like this has ever happened.  Her sister confirms the history above and how the client has had to come to her house in the middle of the night for fear of being killed related to the auditory hallucinations.  No alcohol or substance use, no past history of psych issues.  Discussed inpatient gero for stabilization, client is agreeable along with started Risperdal to assist her symptoms.  Explained the side effects and the purpose of the medicaiton.  Past Psychiatric  History: none  Risk to Self:  none Risk to Others:  none Prior Inpatient Therapy:  none Prior Outpatient Therapy:  none  Past Medical History: prediabetic Family History: No family history on file. Family Psychiatric  History: none Social History:  Social History   Substance and Sexual Activity  Alcohol Use Not Currently     Social History   Substance and Sexual Activity  Drug Use Not on file    Social History   Socioeconomic History   Marital status: Married    Spouse name: Not on file   Number of children: Not on file   Years of education: Not on file   Highest education level: Not on file  Occupational History   Not on file  Tobacco Use   Smoking status: Never   Smokeless tobacco: Never  Substance and Sexual Activity   Alcohol use: Not Currently   Drug use: Not on file   Sexual activity: Not on file  Other Topics Concern   Not on file  Social History Narrative   Not on file   Social Determinants of Health   Financial Resource Strain: Not on file  Food Insecurity: Not on file  Transportation Needs: Not on file  Physical Activity: Not on file  Stress: Not on file  Social Connections: Not on file   Additional Social History:    Allergies:  No Known Allergies  Labs:  Results for orders placed or performed during the hospital encounter of 05/21/23 (from the past 48 hour(s))  CBC with Differential     Status: Abnormal   Collection Time: 05/21/23 11:52 AM  Result Value Ref Range   WBC 9.8 4.0 - 10.5 K/uL   RBC 4.27 3.87 - 5.11 MIL/uL   Hemoglobin 12.0 12.0 - 15.0 g/dL   HCT 40.9 81.1 - 91.4 %   MCV 89.7 80.0 - 100.0 fL   MCH 28.1 26.0 - 34.0 pg   MCHC 31.3 30.0 - 36.0 g/dL   RDW 78.2 (H) 95.6 - 21.3 %   Platelets 290 150 - 400 K/uL   nRBC 0.0 0.0 - 0.2 %   Neutrophils Relative % 78 %   Neutro Abs 7.6 1.7 - 7.7 K/uL   Lymphocytes Relative 14 %   Lymphs Abs 1.4 0.7 - 4.0 K/uL   Monocytes Relative 6 %   Monocytes Absolute 0.6 0.1 - 1.0 K/uL    Eosinophils Relative 1 %   Eosinophils Absolute 0.1 0.0 - 0.5 K/uL   Basophils Relative 1 %   Basophils Absolute 0.1 0.0 - 0.1 K/uL   Immature Granulocytes 0 %   Abs Immature Granulocytes 0.04 0.00 - 0.07 K/uL    Comment: Performed at [facility], [address] [place], [place] [address]  Basic metabolic panel     Status: Abnormal   Collection Time: 05/21/23 11:52 AM  Result Value Ref Range   Sodium 137 135 - 145 mmol/L   Potassium 3.7 3.5 - 5.1 mmol/L   Chloride 102 98 - 111 mmol/L   CO2 27 22 - 32 mmol/L   Glucose, Bld 158 (H) 70 - 99 mg/dL    Comment: Glucose reference range applies only to samples taken after fasting for at least 8 hours.   BUN 19 8 - 23 mg/dL   Creatinine, Ser 8.46 (H) 0.44 - 1.00 mg/dL   Calcium 9.3 8.9 - 96.2 mg/dL   GFR, Estimated 47 (L) >60 mL/min    Comment: (NOTE) Calculated using the CKD-EPI Creatinine Equation (2021)    Anion gap 8 5 - 15    Comment: Performed at [facility], [address] [place], [place] [address]  Urinalysis, Routine w reflex microscopic -Urine, Clean Catch     Status: Abnormal   Collection Time: 05/21/23 11:52 AM  Result Value Ref Range   Color, Urine YELLOW (A) YELLOW   APPearance HAZY (A) CLEAR   Specific Gravity, Urine 1.015 1.005 - 1.030   pH 6.0 5.0 - 8.0   Glucose, UA 50 (A) NEGATIVE mg/dL   Hgb urine dipstick SMALL (A) NEGATIVE   Bilirubin Urine NEGATIVE NEGATIVE   Ketones, ur NEGATIVE NEGATIVE mg/dL   Protein, ur NEGATIVE NEGATIVE mg/dL   Nitrite NEGATIVE NEGATIVE   Leukocytes,Ua SMALL (A) NEGATIVE   RBC / HPF 6-10 0 - 5 RBC/hpf   WBC, UA 11-20 0 - 5 WBC/hpf   Bacteria, UA MANY (A) NONE SEEN   Squamous Epithelial / HPF 6-10 0 - 5 /HPF   Mucus PRESENT     Comment: Performed at [facility], [address] [place], [place] [address]    No current facility-administered medications for this encounter.   No current outpatient medications on file.    Musculoskeletal: Strength  & Muscle Tone: within normal limits Gait & Station: normal Patient leans: N/A  Psychiatric Specialty Exam: Physical Exam Vitals and nursing note reviewed.  Constitutional:      Appearance: Normal appearance.  HENT:     Head: Normocephalic.     Nose: Nose normal.  Pulmonary:     Effort: Pulmonary effort is normal.  Musculoskeletal:        General: Normal range of motion.     Cervical back: Normal range of motion.  Neurological:     General: No focal deficit present.     Mental Status: She is alert and oriented to person, place, and time.  Psychiatric:        Attention and Perception: Attention normal. She perceives auditory hallucinations.        Mood and Affect: Mood is anxious.        Speech: Speech normal.        Behavior: Behavior normal. Behavior is cooperative.        Thought Content: Thought content is delusional.        Cognition and Memory: Cognition and memory normal.        Judgment: Judgment normal.     Review of Systems  Psychiatric/Behavioral:  Positive for hallucinations. The patient is nervous/anxious.   All other systems reviewed and are negative.   Blood pressure (!) 144/89, pulse 70, temperature 98.6 F (37 C), temperature source Oral, resp. rate 19, height 5\' 4"  (1.626 m), weight 107.5 kg, SpO2 98 %.Body mass index is 40.68 kg/m.  General Appearance: Casual  Eye Contact:  Fair  Speech:  Normal Rate  Volume:  Normal  Mood:  Anxious  Affect:  Congruent  Thought Process:  Coherent  Orientation:  Full (Time, Place, and Person)  Thought Content:  Delusions, Hallucinations: Auditory, and Paranoid Ideation  Suicidal Thoughts:  No  Homicidal Thoughts:  No  Memory:  Immediate;   Good Recent;   Good Remote;   Good  Judgement:  Fair  Insight:  Good  Psychomotor Activity:  Normal  Concentration:  Concentration: Fair and Attention Span: Fair  Recall:  Good  Fund of Knowledge:  Good  Language:  Good  Akathisia:  No  Handed:  Right  AIMS (if indicated):      Assets:  Housing Leisure Time Physical Health Resilience Social Support  ADL's:  Intact  Cognition:  WNL  Sleep:        Physical Exam: Physical Exam Vitals and nursing note reviewed.  Constitutional:      Appearance: Normal appearance.  HENT:     Head: Normocephalic.     Nose: Nose normal.  Pulmonary:     Effort: Pulmonary effort is normal.  Musculoskeletal:        General: Normal range of motion.     Cervical back: Normal range of motion.  Neurological:     General: No focal deficit present.     Mental Status: She is alert and oriented to person, place, and time.  Psychiatric:        Attention and Perception: Attention normal. She perceives auditory hallucinations.        Mood and Affect: Mood is anxious.        Speech: Speech normal.        Behavior: Behavior normal. Behavior is cooperative.        Thought Content: Thought content is delusional.        Cognition and Memory: Cognition and memory normal.        Judgment: Judgment normal.    Review of Systems  Psychiatric/Behavioral:  Positive for hallucinations. The patient is nervous/anxious.   All other systems reviewed and are negative.  Blood pressure (!) 144/89, pulse 70, temperature 98.6 F (37 C), temperature source Oral, resp. rate 19, height 5\' 4"  (  1.626 m), weight 107.5 kg, SpO2 98 %. Body mass index is 40.68 kg/m.  Treatment Plan Summary: Daily contact with patient to assess and evaluate symptoms and progress in treatment, Medication management, and Plan : Acute psychosis: Started Risperdal 0.5 mg BID Admit to gero psych   Disposition: Recommend psychiatric Inpatient admission when medically cleared.  Nanine Means, NP 05/21/2023 2:36 PM

## 2023-05-21 NOTE — BH Assessment (Signed)
Comprehensive Clinical Assessment (CCA) Note  05/21/2023 Pamela Benson [id number]  Chief Complaint:  Chief Complaint  Patient presents with   Stress   Hallucinations    Patient is here today with her sister with concerns over stress and anxiety related to problems in her neighborhood; She states that she has not slept well in "months" and sleeps at most 4 hours a night; She also reports AH of people chanting and someone saying "I'm gonna kill you."; She denies SI/HI; She was recently started on Buspar by her PCP but has had no relief with it   Visit Diagnosis: Acute Psychosis   Pamela Benson is a 77 year old female who presents to the ER with her sister due to hearing voices. Patient reports she has heard the voice since January and it's increasing. She states the voice is a female and it's saying she is going to harm her. It has caused her to lose sleep and anxiety. She has called 911 approximately 73 times, since January. She's afraid of getting hurt. The voice talks throughout the day and night. Family installed video cameras around the home, and there is no footage of anyone being near the home or the patient.   During the interview, the patient was calm, cooperative and pleasant. She was able to provide appropriate answers to the questions. She denies SI/HI and V/H. She also denies the [place] of mind-altering substance.   Patient provided verbal consent for the sister to remain in the room during the interview. She provided feedback about the patient.  CCA Screening, Triage and Referral (STR)  Patient Reported Information How did you hear about [place]? Family/Friend  What Is the Reason for Your Visit/Call Today? Patient hearing voices and causing her distress.  How Long Has This Been Causing You Problems? 1 wk - 1 month  What Do You Feel Would Help You the Most Today? Treatment for Depression or other mood problem   Have You Recently Had Any Thoughts About Hurting Yourself? No  Are  You Planning to Commit Suicide/Harm Yourself At This time? No   Flowsheet Row ED from 05/21/2023 in [facility] Emergency Department at [facility] ED from 05/13/2022 in [facility] Emergency Department at [facility]  C-SSRS RISK CATEGORY No Risk No Risk       Have you Recently Had Thoughts About Hurting Someone Pamela Benson? No  Are You Planning to Harm Someone at This Time? No  Explanation: No data recorded  Have You Used Any Alcohol or Drugs in the Past 24 Hours? No  What Did You Use and How Much? No data recorded  Do You Currently Have a Therapist/Psychiatrist? No  Name of Therapist/Psychiatrist:    Have You Been Recently Discharged From Any Office Practice or Programs? No  Explanation of Discharge From Practice/Program: No data recorded    CCA Screening Triage Referral Assessment Type of Contact: Face-to-Face  Telemedicine Service Delivery:   Is this Initial or Reassessment?   Date Telepsych consult ordered in [facility]:    Time Telepsych consult ordered in [facility]:    Location of Assessment: [facility] ED  Provider Location: [facility] ED   Collateral Involvement: No data recorded  Does Patient Have a Court Appointed Legal Guardian? No  Legal Guardian Contact Information: No data recorded Copy of Legal Guardianship Form: No data recorded Legal Guardian Notified of Arrival: No data recorded Legal Guardian Notified of Pending Discharge: No data recorded If Minor and Not Living with Parent(s), Who has Custody? No data recorded Is  CPS involved or ever been involved? Never  Is APS involved or ever been involved? Never   Patient Determined To Be At Risk for Harm To Self or Others Based on Review of Patient Reported Information or Presenting Complaint? No data recorded Method: No data recorded Availability of Means: No data recorded Intent: No data recorded Notification Required: No data recorded Additional Information for Danger to Others Potential: No data recorded Additional  Comments for Danger to Others Potential: No data recorded Are There Guns or Other Weapons in Your Home? No data recorded Types of Guns/Weapons: No data recorded Are These Weapons Safely Secured?                            No data recorded Who Could Verify You Are Able To Have These Secured: No data recorded Do You Have any Outstanding Charges, Pending Court Dates, Parole/Probation? No data recorded Contacted To Inform of Risk of Harm To Self or Others: No data recorded   Does Patient Present under Involuntary Commitment? No    [place] of Residence: [place]   Patient Currently Receiving the Following Services: Not Receiving Services   Determination of Need: Emergent (2 hours)   Options For Referral: Inpatient Hospitalization     CCA Biopsychosocial Patient Reported Schizophrenia/Schizoaffective Diagnosis in Past: No   Strengths: Have supports system, have some insight, and stable housing.   Mental Health Symptoms Depression:   Difficulty Concentrating; Fatigue; Hopelessness; Sleep (too much or little)   Duration of Depressive symptoms:  Duration of Depressive Symptoms: Greater than two weeks   Mania:   None   Anxiety:    Tension; Worrying; Sleep; Restlessness; Difficulty concentrating   Psychosis:   Hallucinations   Duration of Psychotic symptoms:  Duration of Psychotic Symptoms: Greater than six months   Trauma:   N/A   Obsessions:   N/A   Compulsions:   N/A   Inattention:   N/A   Hyperactivity/Impulsivity:   N/A   Oppositional/Defiant Behaviors:   N/A   Emotional Irregularity:   N/A   Other Mood/Personality Symptoms:  No data recorded   Mental Status Exam Appearance and self-care  Stature:   Average   Weight:   Average weight   Clothing:  No data recorded  Grooming:   Normal   Cosmetic use:   None   Posture/gait:   Normal   Motor activity:   -- (Within normal range)   Sensorium  Attention:   Normal   Concentration:    Normal   Orientation:   X5   Recall/memory:   Normal   Affect and Mood  Affect:   Appropriate; Anxious   Mood:   Anxious   Relating  Eye contact:   None   Facial expression:   Anxious   Attitude toward examiner:   Cooperative   Thought and Language  Speech flow:  Clear and Coherent; Normal   Thought content:   Appropriate to Mood and Circumstances   Preoccupation:   None   Hallucinations:   Auditory   Organization:   Coherent; Intact   [employer] of Knowledge:   Fair   Intelligence:   Average   Abstraction:   Normal   Judgement:   Fair   Dance movement psychotherapist:   Adequate   Insight:   Fair   Decision Making:   Normal   Social Functioning  Social Maturity:   Responsible   Social Judgement:   Normal  Stress  Stressors:   Other (Comment)   Coping Ability:   Normal   Skill Deficits:   None   Supports:   Family     Religion:    Leisure/Recreation: Leisure / Recreation Do You Have Hobbies?: No  Exercise/Diet: Exercise/Diet Do You Exercise?: No Have You Gained or Lost A Significant Amount of Weight in the Past Six Months?: No Do You Follow a Special Diet?: No Do You Have Any Trouble Sleeping?: Yes Explanation of Sleeping Difficulties: Trouble staying asleep   CCA Employment/Education Employment/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 Impacted by Current Illness: No  Education: Education Is Patient Currently Attending School?: No Did You Have An Individualized Education Program (IIEP): No Did You Have Any Difficulty At [employer]?: No Patient's Education Has Been Impacted by Current Illness: No   CCA Family/Childhood History Family and Relationship History:    Childhood History:  Childhood History Did patient suffer any verbal/emotional/physical/sexual abuse as a child?: No Did patient suffer from severe childhood neglect?: No Has patient ever been sexually  abused/assaulted/raped as an adolescent or adult?: No Was the patient ever a victim of a crime or a disaster?: No Witnessed domestic violence?: No Has patient been affected by domestic violence as an adult?: No  CCA Substance Use Alcohol/Drug Use: Alcohol / Drug Use Pain Medications: See PTA Prescriptions: See PTA Over the Counter: na History of alcohol / drug use?: No history of alcohol / drug abuse Longest period of sobriety (when/how long): n/a   ASAM's:  Six Dimensions of Multidimensional Assessment  Dimension 1:  Acute Intoxication and/or Withdrawal Potential:      Dimension 2:  Biomedical Conditions and Complications:      Dimension 3:  Emotional, Behavioral, or Cognitive Conditions and Complications:     Dimension 4:  Readiness to Change:     Dimension 5:  Relapse, Continued use, or Continued Problem Potential:     Dimension 6:  Recovery/Living Environment:     ASAM Severity Score:    ASAM Recommended Level of Treatment:     Substance use Disorder (SUD)    Recommendations for Services/Supports/Treatments:    Discharge Disposition:    DSM5 Diagnoses: Patient Active Problem List   Diagnosis Date Noted   Acute psychosis (HCC) 05/21/2023     Referrals to Alternative Service(s): Referred to Alternative Service(s):   Place:   Date:   Time:    Referred to Alternative Service(s):   Place:   Date:   Time:    Referred to Alternative Service(s):   Place:   Date:   Time:    Referred to Alternative Service(s):   Place:   Date:   Time:     Lilyan Gilford MS, LCAS, [facility], [facility] Therapeutic Triage Specialist 05/21/2023 3:33 PM

## 2023-05-22 ENCOUNTER — Inpatient Hospital Stay: Payer: Medicare Other

## 2023-05-22 DIAGNOSIS — F23 Brief psychotic disorder: Secondary | ICD-10-CM | POA: Diagnosis not present

## 2023-05-22 MED ORDER — LEVOTHYROXINE SODIUM 75 MCG PO TABS
75.0000 ug | ORAL_TABLET | Freq: Every day | ORAL | Status: DC
  Administered 2023-05-22 – 2023-05-25 (×4): 75 ug via ORAL
  Filled 2023-05-22 (×4): qty 1

## 2023-05-22 MED ORDER — METFORMIN HCL 500 MG PO TABS
500.0000 mg | ORAL_TABLET | Freq: Two times a day (BID) | ORAL | Status: DC
  Administered 2023-05-22 – 2023-05-26 (×8): 500 mg via ORAL
  Filled 2023-05-22 (×8): qty 1

## 2023-05-22 MED ORDER — LINAGLIPTIN 5 MG PO TABS
2.5000 mg | ORAL_TABLET | Freq: Two times a day (BID) | ORAL | Status: DC
  Administered 2023-05-22 – 2023-05-26 (×8): 2.5 mg via ORAL
  Filled 2023-05-22 (×9): qty 1

## 2023-05-22 MED ORDER — QUETIAPINE FUMARATE 25 MG PO TABS
50.0000 mg | ORAL_TABLET | Freq: Every evening | ORAL | Status: DC | PRN
  Administered 2023-05-22: 50 mg via ORAL
  Filled 2023-05-22: qty 2

## 2023-05-22 NOTE — Plan of Care (Signed)
New Goal as of 05/22/23  Problem: Anxiety Goal: STG - Patient will participate in groups with a calm mood and appropriate response within 5 recreation therapy group sessions Description: STG - Patient will participate in groups with a calm mood and appropriate response within 5 recreation therapy group sessions Outcome: Not Applicable

## 2023-05-22 NOTE — H&P (Addendum)
Psychiatric Admission Assessment Adult  Patient Identification: Pamela Benson MRN:  [id number] Date of Evaluation:  05/22/2023 Chief Complaint:  Acute psychosis (HCC) [F23] Principal Diagnosis: Acute psychosis (HCC) Diagnosis:  Principal Problem:   Acute psychosis (HCC)  History of Present Illness: 77 yo female presented to the ED for auditory hallucinations and delusions, no psych history. On assessment, she requested her sister stay "we share everything" who interjected additional findings. The client reported, "I've had a lot of stress and aggravation and a lot of things other people don't." Since January, she started hearing people telling her they are after her. Sometimes she hears a lady outside her door that sings a song about how she is after her. She stated they come in her yard and hide in her woods, never sees them. These voices occur throughout the day, increase at night. She fears "someone is going to kill me" all the time. This has resulted in her calling the police 73 times about this concerns. Her nephew, a retired Emergency planning/management officer, placed cameras around her home that monitor activity and has shown no abnormal activities. Her sleep initiation is "good" but maintenance is poor related to the fear, anxiety, and voices. Appetite is "good, I cook and everything". She has lived in her home for 21 years and nothing like this has ever happened. Her sister confirms the history above and how the client has had to come to her house in the middle of the night for fear of being killed related to the auditory hallucinations. No alcohol or substance use, no past history of psych issues. Discussed inpatient gero for stabilization, client is agreeable along with started Risperdal to assist her symptoms. Explained the side effects and the purpose of the medicaiton.   Associated Signs/Symptoms: Depression Symptoms:   None (Hypo) Manic Symptoms: None Anxiety Symptoms:  Excessive Worry, Psychotic Symptoms:   Delusions, Hallucinations: Auditory Visual Paranoia, PTSD Symptoms: NA Total Time spent with patient: 1 hour  Past Psychiatric History: None  Is the patient at risk to self? No.  Has the patient been a risk to self in the past 6 months? No.  Has the patient been a risk to self within the distant past? No.  Is the patient a risk to others? No.  Has the patient been a risk to others in the past 6 months? No.  Has the patient been a risk to others within the distant past? No.   [place] Scale:  Flowsheet Row Admission (Current) from 05/21/2023 in [facility] BEHAVIORAL MEDICINE Most recent reading at 05/22/2023 10:00 AM ED from 05/21/2023 in [facility] Emergency Department at [facility] Most recent reading at 05/21/2023  3:11 PM ED from 05/13/2022 in [facility] Emergency Department at [facility] Most recent reading at 05/13/2022  7:13 PM  C-SSRS RISK CATEGORY No Risk No Risk No Risk        Prior Inpatient Therapy: No. If yes, describe  Prior Outpatient Therapy: No. If yes, describe   Alcohol Screening: 1. How often do you have a drink containing alcohol?: Never 2. How many drinks containing alcohol do you have on a typical day when you are drinking?: 1 or 2 3. How often do you have six or more drinks on one occasion?: Never AUDIT-C Score: 0 4. How often during the last year have you found that you were not able to stop drinking once you had started?: Never 5. How often during the last year have you failed to do what was normally  expected from you because of drinking?: Never 6. How often during the last year have you needed a first drink in the morning to get yourself going after a heavy drinking session?: Never 7. How often during the last year have you had a feeling of guilt of remorse after drinking?: Never 8. How often during the last year have you been unable to remember what happened the night before because you had been drinking?: Never 9. Have you or someone else  been injured as a result of your drinking?: No 10. Has a relative or friend or a doctor or another health worker been concerned about your drinking or suggested you cut down?: No Alcohol Use Disorder Identification Test Final Score (AUDIT): 0 Alcohol Brief Interventions/Follow-up: Alcohol education/Brief advice, Patient Refused Substance Abuse History in the last 12 months:  No. Consequences of Substance Abuse: NA Previous Psychotropic Medications: No  Psychological Evaluations: No  Past Medical History: History reviewed. No pertinent past medical history. History reviewed. No pertinent surgical history. Family History: History reviewed. No pertinent family history. Family Psychiatric  History: None Tobacco Screening:  Social History   Tobacco Use  Smoking Status Never  Smokeless Tobacco Never    BH Tobacco Counseling     Are you interested in Tobacco Cessation Medications?  No value filed. Counseled patient on smoking cessation:  No value filed. Reason Tobacco Screening Not Completed: No value filed.       Social History:  Social History   Substance and Sexual Activity  Alcohol Use Not Currently     Social History   Substance and Sexual Activity  Drug Use Not on file    Additional Social History:                           Allergies:  No Known Allergies Lab Results:  Results for orders placed or performed during the hospital encounter of 05/21/23 (from the past 48 hour(s))  CBC with Differential     Status: Abnormal   Collection Time: 05/21/23 11:52 AM  Result Value Ref Range   WBC 9.8 4.0 - 10.5 K/uL   RBC 4.27 3.87 - 5.11 MIL/uL   Hemoglobin 12.0 12.0 - 15.0 g/dL   HCT 86.5 78.4 - 69.6 %   MCV 89.7 80.0 - 100.0 fL   MCH 28.1 26.0 - 34.0 pg   MCHC 31.3 30.0 - 36.0 g/dL   RDW 29.5 (H) 28.4 - 13.2 %   Platelets 290 150 - 400 K/uL   nRBC 0.0 0.0 - 0.2 %   Neutrophils Relative % 78 %   Neutro Abs 7.6 1.7 - 7.7 K/uL   Lymphocytes Relative 14 %    Lymphs Abs 1.4 0.7 - 4.0 K/uL   Monocytes Relative 6 %   Monocytes Absolute 0.6 0.1 - 1.0 K/uL   Eosinophils Relative 1 %   Eosinophils Absolute 0.1 0.0 - 0.5 K/uL   Basophils Relative 1 %   Basophils Absolute 0.1 0.0 - 0.1 K/uL   Immature Granulocytes 0 %   Abs Immature Granulocytes 0.04 0.00 - 0.07 K/uL    Comment: Performed at [facility], [address], [place], [place] [address]  Basic metabolic panel     Status: Abnormal   Collection Time: 05/21/23 11:52 AM  Result Value Ref Range   Sodium 137 135 - 145 mmol/L   Potassium 3.7 3.5 - 5.1 mmol/L   Chloride 102 98 - 111 mmol/L   CO2 27  22 - 32 mmol/L   Glucose, Bld 158 (H) 70 - 99 mg/dL    Comment: Glucose reference range applies only to samples taken after fasting for at least 8 hours.   BUN 19 8 - 23 mg/dL   Creatinine, Ser 1.61 (H) 0.44 - 1.00 mg/dL   Calcium 9.3 8.9 - 09.6 mg/dL   GFR, Estimated 47 (L) >60 mL/min    Comment: (NOTE) Calculated using the CKD-EPI Creatinine Equation (2021)    Anion gap 8 5 - 15    Comment: Performed at [facility], [address] [place], [place] [address]  Urinalysis, Routine w reflex microscopic -Urine, Clean Catch     Status: Abnormal   Collection Time: 05/21/23 11:52 AM  Result Value Ref Range   Color, Urine YELLOW (A) YELLOW   APPearance HAZY (A) CLEAR   Specific Gravity, Urine 1.015 1.005 - 1.030   pH 6.0 5.0 - 8.0   Glucose, UA 50 (A) NEGATIVE mg/dL   Hgb urine dipstick SMALL (A) NEGATIVE   Bilirubin Urine NEGATIVE NEGATIVE   Ketones, ur NEGATIVE NEGATIVE mg/dL   Protein, ur NEGATIVE NEGATIVE mg/dL   Nitrite NEGATIVE NEGATIVE   Leukocytes,Ua SMALL (A) NEGATIVE   RBC / HPF 6-10 0 - 5 RBC/hpf   WBC, UA 11-20 0 - 5 WBC/hpf   Bacteria, UA MANY (A) NONE SEEN   Squamous Epithelial / HPF 6-10 0 - 5 /HPF   Mucus PRESENT     Comment: Performed at [facility], [address] [place], [place] [address]  SARS Coronavirus 2 by RT PCR (hospital order,  performed in [facility] hospital lab) *cepheid single result test* Anterior Nasal Swab     Status: None   Collection Time: 05/21/23  4:58 PM   Specimen: Anterior Nasal Swab  Result Value Ref Range   SARS Coronavirus 2 by RT PCR NEGATIVE NEGATIVE    Comment: (NOTE) SARS-CoV-2 target nucleic acids are NOT DETECTED.  The SARS-CoV-2 RNA is generally detectable in upper and lower respiratory specimens during the acute phase of infection. The lowest concentration of SARS-CoV-2 viral copies this assay can detect is 250 copies / mL. A negative result does not preclude SARS-CoV-2 infection and should not be used as the sole basis for treatment or other patient management decisions.  A negative result may occur with improper specimen collection / handling, submission of specimen other than nasopharyngeal swab, presence of viral mutation(s) within the areas targeted by this assay, and inadequate number of viral copies (<250 copies / mL). A negative result must be combined with clinical observations, patient history, and epidemiological information.  Fact Sheet for Patients:   RoadLapTop.co.za  Fact Sheet for Healthcare Providers: http://kim-miller.com/  This test is not yet approved or  cleared by the [place] FDA and has been authorized for detection and/or diagnosis of SARS-CoV-2 by FDA under an Emergency Use Authorization (EUA).  This EUA will remain in effect (meaning this test can be used) for the duration of the COVID-19 declaration under Section 564(b)(1) of the Act, 21 U.S.C. section 360bbb-3(b)(1), unless the authorization is terminated or revoked sooner.  Performed at [facility], [address] [place], [place] [address]     Blood Alcohol level:  No results found for: "ETH"  Metabolic Disorder Labs:  No results found for: "HGBA1C", "MPG" No results found for: "PROLACTIN" No results found for: "CHOL", "TRIG",  "HDL", "CHOLHDL", "VLDL", "LDLCALC"  Current Medications: Current Facility-Administered Medications  Medication Dose Route Frequency Provider  Last Rate Last Admin   acetaminophen (TYLENOL) tablet 650 mg  650 mg Oral Q6H PRN Charm Rings, NP       alum & mag hydroxide-simeth (MAALOX/MYLANTA) 200-200-20 MG/5ML suspension 30 mL  30 mL Oral Q4H PRN Charm Rings, NP       levothyroxine (SYNTHROID) tablet 75 mcg  75 mcg Oral Q0600 Sarina Ill, DO       linagliptin [facility]) tablet 2.5 mg  2.5 mg Oral BID AC Ananya Mccleese Ramon Dredge, DO       magnesium hydroxide (MILK OF MAGNESIA) suspension 30 mL  30 mL Oral Daily PRN Charm Rings, NP       metFORMIN (GLUCOPHAGE) tablet 500 mg  500 mg Oral BID WC Sarina Ill, DO       OLANZapine [facility]) tablet 5 mg  5 mg Oral Daily PRN Charm Rings, NP       Or   OLANZapine (ZYPREXA) injection 5 mg  5 mg Intramuscular Daily PRN Charm Rings, NP       risperiDONE (RISPERDAL) tablet 0.5 mg  0.5 mg Oral BID Charm Rings, NP   0.5 mg at 05/22/23 1610   PTA Medications: Medications Prior to Admission  Medication Sig Dispense Refill Last Dose   alendronate (FOSAMAX) 70 MG tablet Take 70 mg by mouth once a week.      busPIRone (BUSPAR) 10 MG tablet Take 10 mg by mouth 2 (two) times daily as needed.      furosemide (LASIX) 20 MG tablet Take 20 mg by mouth daily.      JENTADUETO 2.5-500 MG TABS Take 1 tablet by mouth daily.      KLAYESTA powder Apply 1 Application topically 2 (two) times daily.      SYNTHROID 75 MCG tablet Take 75 mcg by mouth daily.       Musculoskeletal: Strength & Muscle Tone: within normal limits Gait & Station: normal Patient leans: N/A            Psychiatric Specialty Exam:  Presentation  General Appearance: No data recorded Eye Contact:No data recorded Speech:No data recorded Speech Volume:No data recorded Handedness:No data recorded  Mood and Affect  Mood:No data  recorded Affect:No data recorded  Thought Process  Thought Processes:No data recorded Duration of Psychotic Symptoms:N/A Past Diagnosis of Schizophrenia or Psychoactive disorder: No  Descriptions of Associations:No data recorded Orientation:No data recorded Thought Content:No data recorded Hallucinations:No data recorded Ideas of Reference:No data recorded Suicidal Thoughts:No data recorded Homicidal Thoughts:No data recorded  Sensorium  Memory:No data recorded Judgment:No data recorded Insight:No data recorded  Executive Functions  Concentration:No data recorded Attention Span:No data recorded Recall:No data recorded Fund of Knowledge:No data recorded Language:No data recorded  Psychomotor Activity  Psychomotor Activity:No data recorded  Assets  Assets:No data recorded  Sleep  Sleep:No data recorded   Physical Exam: Physical Exam Constitutional:      Appearance: Normal appearance.  HENT:     Head: Normocephalic and atraumatic.     Mouth/Throat:     Pharynx: Oropharynx is clear.  Eyes:     Pupils: Pupils are equal, round, and reactive to light.  Cardiovascular:     Rate and Rhythm: Normal rate and regular rhythm.  Pulmonary:     Effort: Pulmonary effort is normal.     Breath sounds: Normal breath sounds.  Abdominal:     General: Abdomen is flat.     Palpations: Abdomen is soft.  Musculoskeletal:  General: Normal range of motion.  Skin:    General: Skin is warm and dry.  Neurological:     General: No focal deficit present.     Mental Status: She is alert. Mental status is at baseline.  Psychiatric:        Attention and Perception: Attention normal. She perceives auditory and visual hallucinations.        Mood and Affect: Affect normal. Mood is anxious.        Speech: Speech normal.        Behavior: Behavior normal. Behavior is cooperative.        Thought Content: Thought content is paranoid and delusional.        Cognition and Memory: Memory  normal. Cognition is impaired.        Judgment: Judgment is inappropriate.    Review of Systems  Constitutional: Negative.   HENT: Negative.    Eyes: Negative.   Respiratory: Negative.    Cardiovascular: Negative.   Gastrointestinal: Negative.   Genitourinary: Negative.   Musculoskeletal: Negative.   Skin: Negative.   Neurological: Negative.   Endo/Heme/Allergies: Negative.   Psychiatric/Behavioral:  Positive for hallucinations. The patient is nervous/anxious and has insomnia.    Blood pressure (!) 135/56, pulse (!) 57, temperature 98.3 F (36.8 C), height 5\' 4"  (1.626 m), weight 104.8 kg, SpO2 99 %. Body mass index is 39.65 kg/m.  Treatment Plan Summary: Daily contact with patient to assess and evaluate symptoms and progress in treatment, Medication management, and Plan see orders  Observation Level/Precautions:  15 minute checks  Laboratory:  CBC Chemistry Profile CT head  Psychotherapy:    Medications:    Consultations:    Discharge Concerns:    Estimated LOS:  Other:     Physician Treatment Plan for Primary Diagnosis: Acute psychosis (HCC) Long Term Goal(s): Improvement in symptoms so as ready for discharge  Short Term Goals: Ability to identify changes in lifestyle to reduce recurrence of condition will improve, Ability to verbalize feelings will improve, Ability to disclose and discuss suicidal ideas, Ability to demonstrate self-control will improve, Ability to identify and develop effective coping behaviors will improve, Ability to maintain clinical measurements within normal limits will improve, Compliance with prescribed medications will improve, and Ability to identify triggers associated with substance abuse/mental health issues will improve  Physician Treatment Plan for Secondary Diagnosis: Principal Problem:   Acute psychosis (HCC)   I certify that inpatient services furnished can reasonably be expected to improve the patient's condition.    Sarina Ill, DO 7/5/202411:18 AM

## 2023-05-22 NOTE — [facility] Suicide Risk Assessment (Signed)
[facility] INPATIENT:  Family/Significant Other Suicide Prevention Education  Suicide Prevention Education:  Contact Attempts: Nash Dimmer, sister, [phone], (name of family member/significant other) has been identified by the patient as the family member/significant other with whom the patient will be residing, and identified as the person(s) who will aid the patient in the event of a mental health crisis.  With written consent from the patient, two attempts were made to provide suicide prevention education, prior to and/or following the patient's discharge.  We were unsuccessful in providing suicide prevention education.  A suicide education pamphlet was given to the patient to share with family/significant other.  Date and time of first attempt: 05/22/2023 at 3:59PM Date and time of second attempt: Second attempt is needed.  CSW left HIPAA compliant voicemail.  Harden Mo 05/22/2023, 3:59 PM

## 2023-05-22 NOTE — Plan of Care (Signed)
D- Patient alert and oriented.. Denies SI, HI, Pt stated that she "heard some voices in the night" Pt did not elaborate any more about the voices.and pain.   A- Scheduled medications administered to patient, per MD orders. Support and encouragement provided.  Routine safety checks conducted every 15 minutes.  Patient informed to notify staff with problems or concerns.   R- No adverse drug reactions noted. Patient contracts for safety at this time. Patient compliant with medications and treatment plan. Patient receptive, calm, and cooperative. Patient interacts well with others on the unit.  Patient remains safe at this time.

## 2023-05-22 NOTE — Tx Team (Signed)
Initial Treatment Plan 05/22/2023 12:04 AM Pamela Benson ZOX:[id number]    PATIENT STRESSORS: Other: Delusion something is going on in her neighborhood     PATIENT STRENGTHS: Supportive family/friends    PATIENT IDENTIFIED PROBLEMS: Stress, Anxiety related to neighborhood safety                     DISCHARGE CRITERIA:  Improved stabilization in mood, thinking, and/or behavior Medical problems require only outpatient monitoring Need for constant or close observation no longer present  PRELIMINARY DISCHARGE PLAN: Return to previous living arrangement  PATIENT/FAMILY INVOLVEMENT: This treatment plan has been presented to and reviewed with the patient, [name], and/or family member, .  The patient and family have been given the opportunity to ask questions and make suggestions.  Jarome Matin, RN 05/22/2023, 12:04 AM
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
## 2023-05-22 NOTE — BH Assessment (Signed)
Recreation Therapy Notes  INPATIENT RECREATION THERAPY ASSESSMENT  Patient Details Name: Pamela Benson MRN: [id number] DOB: June 04, 1946 Today's Date: 05/22/2023       Information Obtained From: Patient (In addition to chart review)  Able to Participate in Assessment/Interview: Yes  Patient Presentation: Responsive, Oriented, Alert  Reason for Admission (Per Patient): Active Symptoms ("There is this woman that is friends with my neighbor, she keeps coming over to my house telling me that she is going to kill me.")  Patient Stressors: Other (Comment) ("Hearing the chant that she is going to kill me dead is very scary. I get really anxious and scared.")  Coping Skills:   Avoidance  Leisure Interests (2+):  Social - Friends, Garment/textile technologist - Travel (Comment), Music - Listen  Frequency of Recreation/Participation: Pharmacist, community Resources:  Yes  Current Use: Yes  Expressed Interest in [employer] Information: No  [employer] of Residence:  Designer, jewellery"  Patient Main Form of Transportation: Car  Patient Strengths:  "I am a really good cook. I love to make anything really, anything I am in the mood for."  Patient Identified Areas of Improvement:  "I can be controling. I want things to be done right."  Patient Goal for Hospitalization:  "To get over these voices in my head".  Current SI (including self-harm):  No  Current HI:  No  Current AVH: No  Staff Intervention Plan: Group Attendance, Collaborate with Interdisciplinary Treatment Team  Consent to Intern Participation: N/A   Pt shared that he hears voices coming from the back of her house where there are lots of trees for the people to hide. Pt verbally mentioned a woman, however would speak as if there were multiple or mor than one voice she was hearing. LRT asked about this and pt shared that she hears a man and a women voice telling her that they are going to kill her dead. Pt shared  that she is a part of a groupie that like to travel and listen to this band that was originally [employer], however they play many different genres now. Pt shared that they will be in a 3 day festival late September and she is looking forward to that. Pt shared that when she travels, she [place], along with the other people who go to the music festival to hear the band sing. Pt was very pleasant during assessment.   Joanna Hall LRT, CTRS Eleanore Junio E Yaritzi Craun 05/22/2023, 3:37 PM

## 2023-05-22 NOTE — Group Note (Signed)
Date:  05/22/2023 Time:  3:24 PM  Group Topic/Focus:  Managing Feelings:   The focus of this group is to identify what feelings patients have difficulty handling and develop a plan to handle them in a healthier way upon discharge.    Participation Level:  Did Not Attend   Midge Momon P Agapita Savarino 05/22/2023, 3:24 PM  

## 2023-05-22 NOTE — Group Note (Signed)
Recreation Therapy Group Note   Group Topic:Emotion Expression  Group Date: 05/22/2023 Start Time: 1400 End Time: 1450 Facilitators: Rosina Lowenstein, LRT, CTRS Location:  Dayroom  Group Description: Emotional Check in. Patient sat and talked with LRT about how they are doing and whatever else is on their mind. LRT provided active listening, reassurance and encouragement.     Goal Area(s) Addressed: Patient will engage in conversation with LRT. Patient will communicate their wants, needs, or questions.  Patient will practice a new coping skill of "talking to someone".   Affect/Mood: N/A   Participation Level: Did not attend    Clinical Observations/Individualized Feedback: Joellie did not attend group.  Plan: Continue to engage patient in RT group sessions 2-3x/week.   Rosina Lowenstein, LRT, CTRS 05/22/2023 3:26 PM

## 2023-05-22 NOTE — BH IP Treatment Plan (Signed)
Interdisciplinary Treatment and Diagnostic Plan Update  05/22/2023 Time of Session: 10:30AM Pamela Benson MRN: [id number]  Principal Diagnosis: Acute psychosis [facility])  Secondary Diagnoses: Principal Problem:   Acute psychosis (HCC)   Current Medications:  Current Facility-Administered Medications  Medication Dose Route Frequency Provider Last Rate Last Admin   acetaminophen (TYLENOL) tablet 650 mg  650 mg Oral Q6H PRN Charm Rings, NP       alum & mag hydroxide-simeth (MAALOX/MYLANTA) 200-200-20 MG/5ML suspension 30 mL  30 mL Oral Q4H PRN Charm Rings, NP       levothyroxine (SYNTHROID) tablet 75 mcg  75 mcg Oral Q0600 Sarina Ill, DO   75 mcg at 05/22/23 1151   linagliptin (TRADJENTA) tablet 2.5 mg  2.5 mg Oral BID AC Herrick, Richard Ramon Dredge, DO       magnesium hydroxide (MILK OF MAGNESIA) suspension 30 mL  30 mL Oral Daily PRN Charm Rings, NP       metFORMIN (GLUCOPHAGE) tablet 500 mg  500 mg Oral BID WC Herrick, Richard Edward, DO       OLANZapine (ZYPREXA) tablet 5 mg  5 mg Oral Daily PRN Charm Rings, NP       Or   OLANZapine (ZYPREXA) injection 5 mg  5 mg Intramuscular Daily PRN Charm Rings, NP       QUEtiapine (SEROQUEL) tablet 50 mg  50 mg Oral QHS PRN Sarina Ill, DO       risperiDONE (RISPERDAL) tablet 0.5 mg  0.5 mg Oral BID Charm Rings, NP   0.5 mg at 05/22/23 8657   PTA Medications: Medications Prior to Admission  Medication Sig Dispense Refill Last Dose   alendronate (FOSAMAX) 70 MG tablet Take 70 mg by mouth once a week.      busPIRone (BUSPAR) 10 MG tablet Take 10 mg by mouth 2 (two) times daily as needed.      furosemide (LASIX) 20 MG tablet Take 20 mg by mouth daily.      JENTADUETO 2.5-500 MG TABS Take 1 tablet by mouth daily.      KLAYESTA powder Apply 1 Application topically 2 (two) times daily.      SYNTHROID 75 MCG tablet Take 75 mcg by mouth daily.       Patient Stressors: Other: Delusion something is going on  in her neighborhood    Patient Strengths: Supportive family/friends   Treatment Modalities: Medication Management, Group therapy, Case management,  1 to 1 session with clinician, Psychoeducation, Recreational therapy.   Physician Treatment Plan for Primary Diagnosis: Acute psychosis (HCC) Long Term Goal(s): Improvement in symptoms so as ready for discharge   Short Term Goals: Ability to identify changes in lifestyle to reduce recurrence of condition will improve Ability to verbalize feelings will improve Ability to disclose and discuss suicidal ideas Ability to demonstrate self-control will improve Ability to identify and develop effective coping behaviors will improve Ability to maintain clinical measurements within normal limits will improve Compliance with prescribed medications will improve Ability to identify triggers associated with substance abuse/mental health issues will improve  Medication Management: Evaluate patient's response, side effects, and tolerance of medication regimen.  Therapeutic Interventions: 1 to 1 sessions, Unit Group sessions and Medication administration.  Evaluation of Outcomes: Not Met  Physician Treatment Plan for Secondary Diagnosis: Principal Problem:   Acute psychosis (HCC)  Long Term Goal(s): Improvement in symptoms so as ready for discharge   Short Term Goals: Ability to identify changes in lifestyle to  reduce recurrence of condition will improve Ability to verbalize feelings will improve Ability to disclose and discuss suicidal ideas Ability to demonstrate self-control will improve Ability to identify and develop effective coping behaviors will improve Ability to maintain clinical measurements within normal limits will improve Compliance with prescribed medications will improve Ability to identify triggers associated with substance abuse/mental health issues will improve     Medication Management: Evaluate patient's response, side effects,  and tolerance of medication regimen.  Therapeutic Interventions: 1 to 1 sessions, Unit Group sessions and Medication administration.  Evaluation of Outcomes: Not Met   RN Treatment Plan for Primary Diagnosis: Acute psychosis (HCC) Long Term Goal(s): Knowledge of disease and therapeutic regimen to maintain health will improve  Short Term Goals: Ability to demonstrate self-control, Ability to participate in decision making will improve, Ability to verbalize feelings will improve, Ability to disclose and discuss suicidal ideas, Ability to identify and develop effective coping behaviors will improve, and Compliance with prescribed medications will improve  Medication Management: RN will administer medications as ordered by provider, will assess and evaluate patient's response and provide education to patient for prescribed medication. RN will report any adverse and/or side effects to prescribing provider.  Therapeutic Interventions: 1 on 1 counseling sessions, Psychoeducation, Medication administration, Evaluate responses to treatment, Monitor vital signs and CBGs as ordered, Perform/monitor CIWA, COWS, AIMS and Fall Risk screenings as ordered, Perform wound care treatments as ordered.  Evaluation of Outcomes: Not Met   LCSW Treatment Plan for Primary Diagnosis: Acute psychosis (HCC) Long Term Goal(s): Safe transition to appropriate next level of care at discharge, Engage patient in therapeutic group addressing interpersonal concerns.  Short Term Goals: Engage patient in aftercare planning with referrals and resources, Increase social support, Increase ability to appropriately verbalize feelings, Increase emotional regulation, Facilitate acceptance of mental health diagnosis and concerns, and Increase skills for wellness and recovery  Therapeutic Interventions: Assess for all discharge needs, 1 to 1 time with Social worker, Explore available resources and support systems, Assess for adequacy in  community support network, Educate family and significant other(s) on suicide prevention, Complete Psychosocial Assessment, Interpersonal group therapy.  Evaluation of Outcomes: Not Met   Progress in Treatment: Attending groups: Yes. Participating in groups: Yes. Taking medication as prescribed: Yes. Toleration medication: Yes. Family/Significant other contact made: No, will contact:  once permission is given. Patient understands diagnosis: No. Discussing patient identified problems/goals with staff: Yes. Medical problems stabilized or resolved: Yes. Denies suicidal/homicidal ideation: Yes. Issues/concerns per patient self-inventory: No. Other: none  New problem(s) identified: No, Describe:  none  New Short Term/Long Term Goal(s):  elimination of symptoms of psychosis, medication management for mood stabilization; elimination of SI thoughts; development of comprehensive mental wellness plan.   Patient Goals:  "to stop these voices that are evil and this fear"  Discharge Plan or Barriers: CSW to assist patient in development of appropriate discharge plans.  Patient is apprehensive of plans for aftercare treatment.    Reason for Continuation of Hospitalization: Anxiety Delusions  Depression Hallucinations Medication stabilization  Estimated Length of Stay:  1-7 days  Last 3 [place] Suicide Severity Risk Score: Flowsheet Row Admission (Current) from 05/21/2023 in [facility] BE[facility]IORAL [facility] Most recent reading at 05/22/2023 10:00 AM ED from 05/21/2023 in [facility] Department at [facility] Most recent reading at 05/21/2023  3:11 PM ED from 05/13/2022 in [facility] Emergency Department at [facility] Most recent reading at 05/13/2022  7:13 PM  C-SSRS RISK CATEGORY No Risk No Risk  No Risk       Last PHQ 2/9 Scores:     No data to display          Scribe for Treatment Team: Harden Mo, LCSW 05/22/2023 2:02 PM

## 2023-05-22 NOTE — Group Note (Signed)
[facility] LCSW Group Therapy Note   Group Date: 05/22/2023 Start Time: 1315 End Time: 1355   Type of Therapy and Topic: Group Therapy: Avoiding Self-Sabotaging and Enabling Behaviors  Participation Level: Active  Mood:  Description of Group:  In this group, patients will learn how to identify obstacles, self-sabotaging and enabling behaviors, as well as: what are they, why do we do them and what needs these behaviors meet. Discuss unhealthy relationships and how to have positive healthy boundaries with those that sabotage and enable. Explore aspects of self-sabotage and enabling in yourself and how to limit these self-destructive behaviors in everyday life.  Discuss challenging irrational thoughts and how to address them.   Therapeutic Goals: 1. Patient will identify one obstacle that relates to self-sabotage and enabling behaviors 2. Patient will identify one personal self-sabotaging or enabling behavior they did prior to admission 3. Patient will state a plan to change the above identified behavior 4. Patient will demonstrate ability to communicate their needs through discussion and/or role play.    Summary of Patient Progress: Patient was present in group. Patient discussed how she has struggled with her mental health.  Patient was able to identify triggers to her current mental status.  Patient appeared attentive and supportive throughout group.    Therapeutic Modalities:  Cognitive Behavioral Therapy Person-Centered Therapy Motivational Interviewing    Harden Mo, LCSW

## 2023-05-22 NOTE — Progress Notes (Signed)
Admission Note: report received from Darl Pikes RN patient is a 53 VOL year old female from [facility], ....... Pt was calm and cooperative during assessment. Denies SI/HI/AVH. Admission plan of care reviewed with pt, consent signed.  Personal belongings/skin assessment completed.   No contraband found.  Patient oriented to the unit, staff and room.  Routine safety checks initiated.  Verbalizes understanding of unit rules/protocols.   Patient is presently safe on the unit. No unsafe behaviors noted.  Q 15 minute safety checks maintained per unit protocol.

## 2023-05-22 NOTE — [facility] Suicide Risk Assessment (Signed)
[facility] Admission Suicide Risk Assessment   Nursing information obtained from:  Patient Demographic factors:  Age 77 or older Current Mental Status:  NA Loss Factors:  NA Historical Factors:  NA Risk Reduction Factors:  Positive therapeutic relationship  Total Time spent with patient: 1 hour Principal Problem: Acute psychosis (HCC) Diagnosis:  Principal Problem:   Acute psychosis (HCC)  Subjective Data: 77 yo female presented to the ED for auditory hallucinations and delusions, no psych history. On assessment, she requested her sister stay "we share everything" who interjected additional findings. The client reported, "I've had a lot of stress and aggravation and a lot of things other people don't." Since January, she started hearing people telling her they are after her. Sometimes she hears a lady outside her door that sings a song about how she is after her. She stated they come in her yard and hide in her woods, never sees them. These voices occur throughout the day, increase at night. She fears "someone is going to kill me" all the time. This has resulted in her calling the police 73 times about this concerns. Her nephew, a retired Emergency planning/management officer, placed cameras around her home that monitor activity and has shown no abnormal activities. Her sleep initiation is "good" but maintenance is poor related to the fear, anxiety, and voices. Appetite is "good, I cook and everything". She has lived in her home for 21 years and nothing like this has ever happened. Her sister confirms the history above and how the client has had to come to her house in the middle of the night for fear of being killed related to the auditory hallucinations. No alcohol or substance use, no past history of psych issues. Discussed inpatient gero for stabilization, client is agreeable along with started Risperdal to assist her symptoms. Explained the side effects and the purpose of the medicaiton.   Continued Clinical Symptoms:   Alcohol Use Disorder Identification Test Final Score (AUDIT): 0 The "Alcohol Use Disorders Identification Test", Guidelines for Use in Primary Care, Second Edition.  World Science writer [facility]). Score between 0-7:  no or low risk or alcohol related problems. Score between 8-15:  moderate risk of alcohol related problems. Score between 16-19:  high risk of alcohol related problems. Score 20 or above:  warrants further diagnostic evaluation for alcohol dependence and treatment.   CLINICAL FACTORS:   Severe Anxiety and/or Agitation   Musculoskeletal: Strength & Muscle Tone: within normal limits Gait & Station: normal Patient leans: N/A  Psychiatric Specialty Exam:  Presentation  General Appearance: No data recorded Eye Contact:No data recorded Speech:No data recorded Speech Volume:No data recorded Handedness:No data recorded  Mood and Affect  Mood:No data recorded Affect:No data recorded  Thought Process  Thought Processes:No data recorded Descriptions of Associations:No data recorded Orientation:No data recorded Thought Content:No data recorded History of Schizophrenia/Schizoaffective disorder:No  Duration of Psychotic Symptoms:Greater than six months  Hallucinations:No data recorded Ideas of Reference:No data recorded Suicidal Thoughts:No data recorded Homicidal Thoughts:No data recorded  Sensorium  Memory:No data recorded Judgment:No data recorded Insight:No data recorded  Executive Functions  Concentration:No data recorded Attention Span:No data recorded Recall:No data recorded Fund of Knowledge:No data recorded Language:No data recorded  Psychomotor Activity  Psychomotor Activity:No data recorded  Assets  Assets:No data recorded  Sleep  Sleep:No data recorded    Blood pressure (!) 135/56, pulse (!) 57, temperature 98.3 F (36.8 C), height 5\' 4"  (1.626 m), weight 104.8 kg, SpO2 99 %. Body mass index is 39.65 kg/m.  COGNITIVE FEATURES  THAT CONTRIBUTE TO RISK:  Polarized thinking    SUICIDE RISK:   Minimal: No identifiable suicidal ideation.  Patients presenting with no risk factors but with morbid ruminations; may be classified as minimal risk based on the severity of the depressive symptoms  PLAN OF CARE: See orders  I certify that inpatient services furnished can reasonably be expected to improve the patient's condition.   Yoanna Jurczyk Tresea Mall, DO 05/22/2023, 11:10 AM

## 2023-05-23 DIAGNOSIS — F23 Brief psychotic disorder: Secondary | ICD-10-CM | POA: Diagnosis not present

## 2023-05-23 MED ORDER — QUETIAPINE FUMARATE 25 MG PO TABS
50.0000 mg | ORAL_TABLET | Freq: Every day | ORAL | Status: DC
  Administered 2023-05-23 – 2023-05-25 (×3): 50 mg via ORAL
  Filled 2023-05-23 (×3): qty 2

## 2023-05-23 MED ORDER — ARTIFICIAL TEARS OPHTHALMIC OINT
TOPICAL_OINTMENT | OPHTHALMIC | Status: DC | PRN
  Administered 2023-05-23: 1 via OPHTHALMIC
  Filled 2023-05-23: qty 3.5

## 2023-05-23 NOTE — Plan of Care (Signed)
  Problem: Education: Goal: Knowledge of General Education information will improve Description: Including pain rating scale, medication(s)/side effects and non-pharmacologic comfort measures Outcome: Progressing   Problem: Activity: Goal: Risk for activity intolerance will decrease Outcome: Progressing   Problem: Nutrition: Goal: Adequate nutrition will be maintained Outcome: Progressing   Problem: Coping: Goal: Level of anxiety will decrease Outcome: Progressing   Problem: Skin Integrity: Goal: Risk for impaired skin integrity will decrease Outcome: Progressing   

## 2023-05-23 NOTE — Progress Notes (Signed)
[facility] MD Progress Note  05/23/2023 11:43 AM Pamela Benson  MRN:  [id number] Subjective: Pamela Benson is seen on rounds.  She was started on Risperdal and took Seroquel last night for sleep.  I am squiggly and schedule it now.  She says that she feels much better and that she did not have any hallucinations of people at her bedside.  Her CT of her head was basically normal for her age.  She denies any side effects from her medication.  Nurses report no issues.  Overall, she is improved.  Principal Problem: Acute psychosis (HCC) Diagnosis: Principal Problem:   Acute psychosis (HCC)  Total Time spent with patient: 15 minutes  Past Psychiatric History: Unremarkable  Past Medical History: History reviewed. No pertinent past medical history. History reviewed. No pertinent surgical history. Family History: History reviewed. No pertinent family history. Family Psychiatric  History: Unremarkable Social History:  Social History   Substance and Sexual Activity  Alcohol Use Not Currently     Social History   Substance and Sexual Activity  Drug Use Not on file    Social History   Socioeconomic History   Marital status: Married    Spouse name: Not on file   Number of children: Not on file   Years of education: Not on file   Highest education level: Not on file  Occupational History   Not on file  Tobacco Use   Smoking status: Never   Smokeless tobacco: Never  Substance and Sexual Activity   Alcohol use: Not Currently   Drug use: Not on file   Sexual activity: Not on file  Other Topics Concern   Not on file  Social History Narrative   Not on file   Social Determinants of Health   Financial Resource Strain: Not on file  Food Insecurity: No Food Insecurity (05/21/2023)   Hunger Vital Sign    Worried About Running Out of Food in the Last Year: Never true    Ran Out of Food in the Last Year: Never true  Transportation Needs: No Transportation Needs (05/21/2023)   PRAPARE - Therapist, art (Medical): No    Lack of Transportation (Non-Medical): No  Physical Activity: Not on file  Stress: Not on file  Social Connections: Not on file   Additional Social History:                         Sleep: Good  Appetite:  Good  Current Medications: Current Facility-Administered Medications  Medication Dose Route Frequency Provider Last Rate Last Admin   acetaminophen (TYLENOL) tablet 650 mg  650 mg Oral Q6H PRN Charm Rings, NP       alum & mag hydroxide-simeth (MAALOX/MYLANTA) 200-200-20 MG/5ML suspension 30 mL  30 mL Oral Q4H PRN Charm Rings, NP       artificial tears (LACRILUBE) ophthalmic ointment   Both Eyes Q4H PRN Sarina Ill, DO       levothyroxine (SYNTHROID) tablet 75 mcg  75 mcg Oral Q0600 Sarina Ill, DO   75 mcg at 05/23/23 0631   linagliptin (TRADJENTA) tablet 2.5 mg  2.5 mg Oral BID AC Sarina Ill, DO   2.5 mg at 05/23/23 0825   magnesium hydroxide (MILK OF MAGNESIA) suspension 30 mL  30 mL Oral Daily PRN Charm Rings, NP       metFORMIN (GLUCOPHAGE) tablet 500 mg  500 mg Oral BID WC Elane Fritz  Edward, DO   500 mg at 05/23/23 0825   OLANZapine (ZYPREXA) tablet 5 mg  5 mg Oral Daily PRN Charm Rings, NP       Or   OLANZapine (ZYPREXA) injection 5 mg  5 mg Intramuscular Daily PRN Charm Rings, NP       QUEtiapine (SEROQUEL) tablet 50 mg  50 mg Oral QHS Sarina Ill, DO       risperiDONE (RISPERDAL) tablet 0.5 mg  0.5 mg Oral BID Charm Rings, NP   0.5 mg at 05/23/23 0825    Lab Results:  Results for orders placed or performed during the hospital encounter of 05/21/23 (from the past 48 hour(s))  CBC with Differential     Status: Abnormal   Collection Time: 05/21/23 11:52 AM  Result Value Ref Range   WBC 9.8 4.0 - 10.5 K/uL   RBC 4.27 3.87 - 5.11 MIL/uL   Hemoglobin 12.0 12.0 - 15.0 g/dL   HCT 16.1 09.6 - 04.5 %   MCV 89.7 80.0 - 100.0 fL   MCH 28.1 26.0 -  34.0 pg   MCHC 31.3 30.0 - 36.0 g/dL   RDW 40.9 (H) 81.1 - 91.4 %   Platelets 290 150 - 400 K/uL   nRBC 0.0 0.0 - 0.2 %   Neutrophils Relative % 78 %   Neutro Abs 7.6 1.7 - 7.7 K/uL   Lymphocytes Relative 14 %   Lymphs Abs 1.4 0.7 - 4.0 K/uL   Monocytes Relative 6 %   Monocytes Absolute 0.6 0.1 - 1.0 K/uL   Eosinophils Relative 1 %   Eosinophils Absolute 0.1 0.0 - 0.5 K/uL   Basophils Relative 1 %   Basophils Absolute 0.1 0.0 - 0.1 K/uL   Immature Granulocytes 0 %   Abs Immature Granulocytes 0.04 0.00 - 0.07 K/uL    Comment: Performed at [facility], [address] [place], [place] [address]  Basic metabolic panel     Status: Abnormal   Collection Time: 05/21/23 11:52 AM  Result Value Ref Range   Sodium 137 135 - 145 mmol/L   Potassium 3.7 3.5 - 5.1 mmol/L   Chloride 102 98 - 111 mmol/L   CO2 27 22 - 32 mmol/L   Glucose, Bld 158 (H) 70 - 99 mg/dL    Comment: Glucose reference range applies only to samples taken after fasting for at least 8 hours.   BUN 19 8 - 23 mg/dL   Creatinine, Ser 6.21 (H) 0.44 - 1.00 mg/dL   Calcium 9.3 8.9 - 30.8 mg/dL   GFR, Estimated 47 (L) >60 mL/min    Comment: (NOTE) Calculated using the CKD-EPI Creatinine Equation (2021)    Anion gap 8 5 - 15    Comment: Performed at [facility], [address] [place], [place] [address]  Urinalysis, Routine w reflex microscopic -Urine, Clean Catch     Status: Abnormal   Collection Time: 05/21/23 11:52 AM  Result Value Ref Range   Color, Urine YELLOW (A) YELLOW   APPearance HAZY (A) CLEAR   Specific Gravity, Urine 1.015 1.005 - 1.030   pH 6.0 5.0 - 8.0   Glucose, UA 50 (A) NEGATIVE mg/dL   Hgb urine dipstick SMALL (A) NEGATIVE   Bilirubin Urine NEGATIVE NEGATIVE   Ketones, ur NEGATIVE NEGATIVE mg/dL   Protein, ur NEGATIVE NEGATIVE mg/dL   Nitrite NEGATIVE NEGATIVE   Leukocytes,Ua SMALL (A) NEGATIVE   RBC / HPF 6-10 0 - 5 RBC/hpf  WBC, UA 11-20 0 - 5 WBC/hpf   Bacteria, UA MANY  (A) NONE SEEN   Squamous Epithelial / HPF 6-10 0 - 5 /HPF   Mucus PRESENT     Comment: Performed at [facility], [address] [place], [place] [address]  SARS Coronavirus 2 by RT PCR (hospital order, performed in [facility] hospital lab) *cepheid single result test* Anterior Nasal Swab     Status: None   Collection Time: 05/21/23  4:58 PM   Specimen: Anterior Nasal Swab  Result Value Ref Range   SARS Coronavirus 2 by RT PCR NEGATIVE NEGATIVE    Comment: (NOTE) SARS-CoV-2 target nucleic acids are NOT DETECTED.  The SARS-CoV-2 RNA is generally detectable in upper and lower respiratory specimens during the acute phase of infection. The lowest concentration of SARS-CoV-2 viral copies this assay can detect is 250 copies / mL. A negative result does not preclude SARS-CoV-2 infection and should not be used as the sole basis for treatment or other patient management decisions.  A negative result may occur with improper specimen collection / handling, submission of specimen other than nasopharyngeal swab, presence of viral mutation(s) within the areas targeted by this assay, and inadequate number of viral copies (<250 copies / mL). A negative result must be combined with clinical observations, patient history, and epidemiological information.  Fact Sheet for Patients:   RoadLapTop.co.za  Fact Sheet for Healthcare Providers: http://kim-miller.com/  This test is not yet approved or  cleared by the [place] FDA and has been authorized for detection and/or diagnosis of SARS-CoV-2 by FDA under an Emergency Use Authorization (EUA).  This EUA will remain in effect (meaning this test can be used) for the duration of the COVID-19 declaration under Section 564(b)(1) of the Act, 21 U.S.C. section 360bbb-3(b)(1), unless the authorization is terminated or revoked sooner.  Performed at [facility], [address]  [place], [place] [address]     Blood Alcohol level:  No results found for: "ETH"  Metabolic Disorder Labs: No results found for: "HGBA1C", "MPG" No results found for: "PROLACTIN" No results found for: "CHOL", "TRIG", "HDL", "CHOLHDL", "VLDL", "LDLCALC"  Physical Findings: AIMS:  , ,  ,  ,    CIWA:    COWS:     Musculoskeletal: Strength & Muscle Tone: within normal limits Gait & Station: normal Patient leans: N/A  Psychiatric Specialty Exam:  Presentation  General Appearance: No data recorded Eye Contact:No data recorded Speech:No data recorded Speech Volume:No data recorded Handedness:No data recorded  Mood and Affect  Mood:No data recorded Affect:No data recorded  Thought Process  Thought Processes:No data recorded Descriptions of Associations:No data recorded Orientation:No data recorded Thought Content:No data recorded History of Schizophrenia/Schizoaffective disorder:No  Duration of Psychotic Symptoms:Greater than six months  Hallucinations:No data recorded Ideas of Reference:No data recorded Suicidal Thoughts:No data recorded Homicidal Thoughts:No data recorded  Sensorium  Memory:No data recorded Judgment:No data recorded Insight:No data recorded  Executive Functions  Concentration:No data recorded Attention Span:No data recorded Recall:No data recorded Fund of Knowledge:No data recorded Language:No data recorded  Psychomotor Activity  Psychomotor Activity:No data recorded  Assets  Assets:No data recorded  Sleep  Sleep:No data recorded   Physical Exam: Physical Exam Vitals and nursing note reviewed.  Constitutional:      Appearance: Normal appearance. She is normal weight.  Neurological:     General: No focal deficit present.     Mental Status: She is alert and oriented to person, place, and time.  Psychiatric:  Attention and Perception: Attention and perception normal.        Mood and Affect: Mood and affect normal.         Speech: Speech normal.        Behavior: Behavior normal. Behavior is cooperative.        Thought Content: Thought content normal.        Cognition and Memory: Cognition and memory normal.        Judgment: Judgment normal.    Review of Systems  Constitutional: Negative.   HENT: Negative.    Eyes: Negative.   Respiratory: Negative.    Cardiovascular: Negative.   Gastrointestinal: Negative.   Genitourinary: Negative.   Musculoskeletal: Negative.   Skin: Negative.   Neurological: Negative.   Endo/Heme/Allergies: Negative.   Psychiatric/Behavioral: Negative.     Blood pressure (!) 144/71, pulse 65, temperature 97.9 F (36.6 C), resp. rate 16, height 5\' 4"  (1.626 m), weight 104.8 kg, SpO2 97 %. Body mass index is 39.65 kg/m.   Treatment Plan Summary: Daily contact with patient to assess and evaluate symptoms and progress in treatment, Medication management, and Plan change Seroquel to being scheduled.  Continue Risperdal  Sarina Ill, DO 05/23/2023, 11:43 AM

## 2023-05-23 NOTE — Plan of Care (Signed)
Calm and cooperative. Guarded. Visited with family members. States visit was fine. Limited interaction with peers and staff. Did not participant in group. Po med compliant. Denies SI/HI/AVH. Q 15 min checks maintained for safety. No c/o pain/discomfort noted.   Problem: Clinical Measurements: Goal: Ability to maintain clinical measurements within normal limits will improve Outcome: Progressing Goal: Will remain free from infection Outcome: Progressing Goal: Diagnostic test results will improve Outcome: Progressing Goal: Respiratory complications will improve Outcome: Progressing Goal: Cardiovascular complication will be avoided Outcome: Progressing   Problem: Activity: Goal: Risk for activity intolerance will decrease Outcome: Progressing   Problem: Nutrition: Goal: Adequate nutrition will be maintained Outcome: Progressing   Problem: Coping: Goal: Level of anxiety will decrease Outcome: Progressing

## 2023-05-23 NOTE — Group Note (Signed)
LCSW Group Therapy Note   Group Date: 05/23/2023 Start Time: 1300 End Time: 1345   Type of Therapy and Topic:  Group Therapy: Geriatric Depression  Participation Level:  Active   Summary of Patient Progress:  Patient attended group. Patient was active in listing the causes of depression stating; losing friends, loss of activities and was also active in listing coping skills stating, "go shopping (distraction) and stay active (grounding)." The patient demonstrated self-reflection and redirection.   Marshell Levan, LCSWA 05/23/2023  2:25 PM

## 2023-05-23 NOTE — Group Note (Signed)
Date:  05/23/2023 Time:  11:13 AM  Group Topic/Focus:  Self Care:   The focus of this group is to help patients understand the importance of self-care in order to improve or restore emotional, physical, spiritual, interpersonal, and financial health.    Participation Level:  Active  Participation Quality:  Appropriate  Affect:  Appropriate  Cognitive:  Appropriate  Insight: Appropriate  Engagement in Group:  Engaged  Modes of Intervention:  Activity  Additional Comments:  none  Rodena Goldmann 05/23/2023, 11:13 AM

## 2023-05-23 NOTE — Group Note (Signed)
Date:  05/23/2023 Time:  9:32 PM  Group Topic/Focus:  Healthy Communication:   The focus of this group is to discuss communication, barriers to communication, as well as healthy ways to communicate with others.    Participation Level:  Did Not Attend  Participation Quality:   none  Affect:   none  Cognitive:   none  Insight: None  Engagement in Group:  None  Modes of Intervention:   none  Additional Comments:  Kurtisha did not attend group.  Dallie Piles Merlina Marchena 05/23/2023, 9:32 PM

## 2023-05-23 NOTE — Progress Notes (Signed)
Patient is pleasant and cooperative with assessment. Appropriate affect for situation. Denies SI/HI and VH.  Currently denies AH. "I haven't heard anything."  Denies feelings of anxiety and depression.  "I'm just ready for another day."  Patient ate breakfast and remained in the milieu. Appropriate interaction with peers and staff.  Compliant with scheduled medications.  15 min checks in place for safety.

## 2023-05-23 NOTE — Group Note (Signed)
Date:  05/23/2023 Time:  10:50 AM  Group Topic/Focus:  Developing a Wellness Toolbox:   The focus of this group is to help patients develop a "wellness toolbox" with skills and strategies to promote recovery upon discharge. Rediscovering Joy:   The focus of this group is to explore various ways to relieve stress in a positive manner. Self Care:   The focus of this group is to help patients understand the importance of self-care in order to improve or restore emotional, physical, spiritual, interpersonal, and financial health.    Participation Level:  Active  Participation Quality:  Appropriate  Affect:  Appropriate  Cognitive:  Appropriate  Insight: Appropriate  Engagement in Group:  Engaged  Modes of Intervention:  Activity  Additional Comments:  Enjoyed the music and shared personal experiences regarding hearing her neighbor singing threats to her at night from the yard next door but also in her head.  Leonie Green 05/23/2023, 10:50 AM

## 2023-05-24 DIAGNOSIS — F23 Brief psychotic disorder: Secondary | ICD-10-CM | POA: Diagnosis not present

## 2023-05-24 MED ORDER — POLYVINYL ALCOHOL 1.4 % OP SOLN
1.0000 [drp] | OPHTHALMIC | Status: DC | PRN
  Administered 2023-05-24 (×2): 1 [drp] via OPHTHALMIC
  Filled 2023-05-24 (×2): qty 15

## 2023-05-24 NOTE — Progress Notes (Signed)
Patient pleasant and cooperative.  Present in the dayroom for breakfast.  Patient denies SI/HI and AVH. Patient does not appear to be paranoid or suspicious.  Reports she slept well and had a good appetite this morning. Compliant with scheduled medications.  15 min checks in place for safety.  Patient is present in the milieu.  Appropriate interaction with peers.

## 2023-05-24 NOTE — Progress Notes (Signed)
[facility] MD Progress Note  05/24/2023 1:05 PM Pamela Benson  MRN:  [id number] Subjective: Pamela Benson is seen on rounds.  She states that she is feeling much better.  She is no longer having any hallucinations at bedtime.  She feels like the medications have been very helpful and she is in a good mood.  No side effects from her medicine.  Nurses report no issues. Principal Problem: Acute psychosis (HCC) Diagnosis: Principal Problem:   Acute psychosis (HCC)  Total Time spent with patient: 15 minutes  Past Psychiatric History: Unremarkable  Past Medical History: History reviewed. No pertinent past medical history. History reviewed. No pertinent surgical history. Family History: History reviewed. No pertinent family history. Family Psychiatric  History: Unremarkable Social History:  Social History   Substance and Sexual Activity  Alcohol Use Not Currently     Social History   Substance and Sexual Activity  Drug Use Not on file    Social History   Socioeconomic History   Marital status: Married    Spouse name: Not on file   Number of children: Not on file   Years of education: Not on file   Highest education level: Not on file  Occupational History   Not on file  Tobacco Use   Smoking status: Never   Smokeless tobacco: Never  Substance and Sexual Activity   Alcohol use: Not Currently   Drug use: Not on file   Sexual activity: Not on file  Other Topics Concern   Not on file  Social History Narrative   Not on file   Social Determinants of Health   Financial Resource Strain: Not on file  Food Insecurity: No Food Insecurity (05/21/2023)   Hunger Vital Sign    Worried About Running Out of Food in the Last Year: Never true    Ran Out of Food in the Last Year: Never true  Transportation Needs: No Transportation Needs (05/21/2023)   PRAPARE - Administrator, Civil Service (Medical): No    Lack of Transportation (Non-Medical): No  Physical Activity: Not on file  Stress:  Not on file  Social Connections: Not on file   Additional Social History:                         Sleep: Good  Appetite:  Good  Current Medications: Current Facility-Administered Medications  Medication Dose Route Frequency Provider Last Rate Last Admin   acetaminophen (TYLENOL) tablet 650 mg  650 mg Oral Q6H PRN Charm Rings, NP       alum & mag hydroxide-simeth (MAALOX/MYLANTA) 200-200-20 MG/5ML suspension 30 mL  30 mL Oral Q4H PRN Charm Rings, NP       artificial tears (LACRILUBE) ophthalmic ointment   Both Eyes Q4H PRN Sarina Ill, DO   1 Application at 05/23/23 2232   levothyroxine (SYNTHROID) tablet 75 mcg  75 mcg Oral Q0600 Sarina Ill, DO   75 mcg at 05/24/23 4098   linagliptin (TRADJENTA) tablet 2.5 mg  2.5 mg Oral BID AC Sarina Ill, DO   2.5 mg at 05/24/23 1191   magnesium hydroxide (MILK OF MAGNESIA) suspension 30 mL  30 mL Oral Daily PRN Charm Rings, NP       metFORMIN (GLUCOPHAGE) tablet 500 mg  500 mg Oral BID WC Sarina Ill, DO   500 mg at 05/24/23 0853   OLANZapine (ZYPREXA) tablet 5 mg  5 mg Oral Daily PRN Shaune Pollack,  Herminio Heads, NP       Or   OLANZapine (ZYPREXA) injection 5 mg  5 mg Intramuscular Daily PRN Charm Rings, NP       QUEtiapine (SEROQUEL) tablet 50 mg  50 mg Oral QHS Sarina Ill, DO   50 mg at 05/23/23 2130   risperiDONE (RISPERDAL) tablet 0.5 mg  0.5 mg Oral BID Charm Rings, NP   0.5 mg at 05/24/23 1308    Lab Results: No results found for this or any previous visit (from the past 48 hour(s)).  Blood Alcohol level:  No results found for: "ETH"  Metabolic Disorder Labs: No results found for: "HGBA1C", "MPG" No results found for: "PROLACTIN" No results found for: "CHOL", "TRIG", "HDL", "CHOLHDL", "VLDL", "LDLCALC"  Physical Findings: AIMS:  , ,  ,  ,    CIWA:    COWS:     Musculoskeletal: Strength & Muscle Tone: within normal limits Gait & Station:  normal Patient leans: N/A  Psychiatric Specialty Exam:  Presentation  General Appearance: No data recorded Eye Contact:No data recorded Speech:No data recorded Speech Volume:No data recorded Handedness:No data recorded  Mood and Affect  Mood:No data recorded Affect:No data recorded  Thought Process  Thought Processes:No data recorded Descriptions of Associations:No data recorded Orientation:No data recorded Thought Content:No data recorded History of Schizophrenia/Schizoaffective disorder:No  Duration of Psychotic Symptoms:Greater than six months  Hallucinations:No data recorded Ideas of Reference:No data recorded Suicidal Thoughts:No data recorded Homicidal Thoughts:No data recorded  Sensorium  Memory:No data recorded Judgment:No data recorded Insight:No data recorded  Executive Functions  Concentration:No data recorded Attention Span:No data recorded Recall:No data recorded Fund of Knowledge:No data recorded Language:No data recorded  Psychomotor Activity  Psychomotor Activity:No data recorded  Assets  Assets:No data recorded  Sleep  Sleep:No data recorded    Blood pressure 122/65, pulse 84, temperature 98.8 F (37.1 C), resp. rate 20, height 5\' 4"  (1.626 m), weight 104.8 kg, SpO2 98 %. Body mass index is 39.65 kg/m.   Treatment Plan Summary: Daily contact with patient to assess and evaluate symptoms and progress in treatment, Medication management, and Plan continue current medications.  Idrees Quam [place]sea Mall, DO 05/24/2023, 1:05 PM

## 2023-05-24 NOTE — Group Note (Signed)
Date:  05/24/2023 Time:  9:21 PM  Group Topic/Focus:  Coping With Mental Health Crisis:   The purpose of this group is to help patients identify strategies for coping with mental health crisis.  Group discusses possible causes of crisis and ways to manage them effectively.    Participation Level:  Active  Participation Quality:  Appropriate  Affect:  Appropriate  Cognitive:  Appropriate  Insight: Good  Engagement in Group:  Engaged  Modes of Intervention:  Discussion  Additional Comments:    [name] 05/24/2023, 9:21 PM

## 2023-05-24 NOTE — Group Note (Signed)
Date:  05/24/2023 Time:  2:37 PM  Group Topic/Focus:  Self Care:   The focus of this group is to help patients understand the importance of self-care in order to improve or restore emotional, physical, spiritual, interpersonal, and financial health.    Participation Level:  Did Not Attend    Rodena Goldmann 05/24/2023, 2:37 PM

## 2023-05-24 NOTE — Plan of Care (Signed)
Calm and cooperative. Reports feeling much better says the medication is helping.  Visited with family members. Po medication given as scheduled. No behavior issues noted. Q 15 min checks maintained for safety. No c/o pain/discomfort noted.   Problem: Activity: Goal: Risk for activity intolerance will decrease Outcome: Progressing   Problem: Nutrition: Goal: Adequate nutrition will be maintained Outcome: Progressing   Problem: Coping: Goal: Level of anxiety will decrease Outcome: Progressing   Problem: Elimination: Goal: Will not experience complications related to bowel motility Outcome: Progressing Goal: Will not experience complications related to urinary retention Outcome: Progressing   Problem: Pain Managment: Goal: General experience of comfort will improve Outcome: Progressing

## 2023-05-24 NOTE — [facility] Suicide Risk Assessment (Signed)
[facility] INPATIENT:  Family/Significant Other Suicide Prevention Education  Suicide Prevention Education:  Contact Attempts: Pamela Benson, sister, [phone],  has been identified by the patient as the family member/significant other with whom the patient will be residing, and identified as the person(s) who will aid the patient in the event of a mental health crisis.  With written consent from the patient, two attempts were made to provide suicide prevention education, prior to and/or following the patient's discharge.  We were unsuccessful in providing suicide prevention education.  A suicide education pamphlet was given to the patient to share with family/significant other.  Date and time of first attempt: 05/22/2023 at 3:59PM  Date and time of second attempt:05/24/23 at 12:40 pm  The call went straight to voicemail.   Pamela Benson 05/24/2023, 12:39 PM

## 2023-05-24 NOTE — Plan of Care (Signed)

## 2023-05-25 DIAGNOSIS — F23 Brief psychotic disorder: Secondary | ICD-10-CM | POA: Diagnosis not present

## 2023-05-25 NOTE — Group Note (Signed)
Recreation Therapy Group Note   Group Topic:Leisure Education  Group Date: 05/25/2023 Start Time: 1400 End Time: 1500 Facilitators: Rosina Lowenstein, LRT, CTRS Location:  Day Room  Group Description: Leisure. Patients were given the option to choose from coloring, singing karaoke, journaling or listening to music. LRT and pts discussed the meaning of leisure, the importance of participating in leisure during their free time/when they're outside of the hospital, as well as how our leisure interests can also serve as coping skills. Pt identified two leisure interests and shared with the group.   Goal Area(s) Addressed:  Patient will identify a current leisure interest.  Patient will learn the definition of "leisure". Patient will practice making a positive decision. Patient will have the opportunity to try a new leisure activity. Patient will communicate with peers and LRT.   Affect/Mood: Appropriate   Participation Level: Active and Engaged   Participation Quality: Independent   Behavior: Appropriate, Calm, and Cooperative   Speech/Thought Process: Coherent   Insight: Good   Judgement: Good   Modes of Intervention: Activity   Patient Response to Interventions:  Attentive, Engaged, Interested , and Receptive   Education Outcome:  Acknowledges education   Clinical Observations/Individualized Feedback: Pamela Benson was active in their participation of session activities and group discussion. Pt identified "travel to the beach or listen to music" as things she does in her free time. Pt chose to color while in group. Pt interacted well with LRT and peers duration of session.   Plan: Continue to engage patient in RT group sessions 2-3x/week.   Rosina Lowenstein, LRT, CTRS 05/25/2023 3:22 PM

## 2023-05-25 NOTE — [facility] Suicide Risk Assessment (Signed)
[facility] INPATIENT:  Family/Significant Other Suicide Prevention Education  Suicide Prevention Education:  Education Completed; Pamela Benson, sister, [phone] been identified by the patient as the family member/significant other with whom the patient will be residing, and identified as the person(s) who will aid the patient in the event of a mental health crisis (suicidal ideations/suicide attempt).  With written consent from the patient, the family member/significant other has been provided the following suicide prevention education, prior to the and/or following the discharge of the patient.  The suicide prevention education provided includes the following: Suicide risk factors Suicide prevention and interventions National Suicide Hotline telephone number [facility] assessment telephone number [facility] Emergency Assistance 911 [facility] and/or Residential Mobile Crisis Unit telephone number  Request made of family/significant other to: Remove weapons (e.g., guns, rifles, knives), all items previously/currently identified as safety concern.   Remove drugs/medications (over-the-counter, prescriptions, illicit drugs), all items previously/currently identified as a safety concern.  The family member/significant other verbalizes understanding of the suicide prevention education information provided.  The family member/significant other agrees to remove the items of safety concern listed above.  CSW spoke with the patient's sister.  She reports that the patient has been experiencing "voices for months, going back to January".  She reports that the patient has told her and pt's nephew that people are outside of her home chanting that they are going to "kill her".  She reports that the patient's nephew placed cameras around the patients home and nothing was captured except "her mowing the grass, birds singing and trash pickup". She reports that the patient does NOT have a  history of hearing things.  She reports that patient's symptoms have increased in the past month.  She is unable to identify any triggers.  She denies access to weapons.  She reports that the only safety concern that she has about hte patient is "her getting so worked up she falls".    Pamela Benson 05/25/2023, 3:35 PM

## 2023-05-25 NOTE — Progress Notes (Signed)
Patient is alert and oriented. She denies SI/HI/AVH. She denies anxiety and depression. "All that has stopped." Appetite good. Denies pain. Patient was visible in the milieu and attended all 3 groups. She interacted positively with staff and other patients.  No PRN's requested. No complaints voiced.  Q15 minute unit checks in place.

## 2023-05-25 NOTE — Plan of Care (Signed)
  Problem: Activity: Goal: Risk for activity intolerance will decrease Outcome: Progressing   Problem: Nutrition: Goal: Adequate nutrition will be maintained Outcome: Progressing   Problem: Coping: Goal: Level of anxiety will decrease Outcome: Progressing   

## 2023-05-25 NOTE — Group Note (Signed)
Date:  05/25/2023 Time:  11:19 AM  Group Topic/Focus:  Wellness Toolbox:   The focus of this group is to discuss various aspects of wellness, balancing those aspects and exploring ways to increase the ability to experience wellness.  Patients will create a wellness toolbox for use upon discharge.    Participation Level:  Active  Participation Quality:  Appropriate  Affect:  Appropriate  Cognitive:  Alert and Appropriate  Insight: Appropriate  Engagement in Group:  Engaged  Modes of Intervention:  Activity  Additional Comments:    Marta Antu 05/25/2023, 11:19 AM

## 2023-05-25 NOTE — Progress Notes (Signed)
[facility] MD Progress Note  05/25/2023 1:46 PM Pamela Benson  MRN:  [id number] Subjective: Pamela Benson is seen on rounds.  She is no longer having any auditory or visual hallucinations at nighttime.  She has been taking her medications as prescribed and denies any side effects.  She is doing well on Risperdal and Seroquel.  She states that she is sleeping well and that she feels much better.  She should be able to go sometime this week with follow up outpatient. Principal Problem: Acute psychosis (HCC) Diagnosis: Principal Problem:   Acute psychosis (HCC)  Total Time spent with patient: 15 minutes  Past Psychiatric History: Unremarkable  Past Medical History: History reviewed. No pertinent past medical history. History reviewed. No pertinent surgical history. Family History: History reviewed. No pertinent family history. Family Psychiatric  History: Unremarkable Social History:  Social History   Substance and Sexual Activity  Alcohol Use Not Currently     Social History   Substance and Sexual Activity  Drug Use Not on file    Social History   Socioeconomic History   Marital status: Married    Spouse name: Not on file   Number of children: Not on file   Years of education: Not on file   Highest education level: Not on file  Occupational History   Not on file  Tobacco Use   Smoking status: Never   Smokeless tobacco: Never  Substance and Sexual Activity   Alcohol use: Not Currently   Drug use: Not on file   Sexual activity: Not on file  Other Topics Concern   Not on file  Social History Narrative   Not on file   Social Determinants of Health   Financial Resource Strain: Not on file  Food Insecurity: No Food Insecurity (05/21/2023)   Hunger Vital Sign    Worried About Running Out of Food in the Last Year: Never true    Ran Out of Food in the Last Year: Never true  Transportation Needs: No Transportation Needs (05/21/2023)   PRAPARE - Administrator, Civil Service  (Medical): No    Lack of Transportation (Non-Medical): No  Physical Activity: Not on file  Stress: Not on file  Social Connections: Not on file   Additional Social History:                         Sleep: Good  Appetite:  Good  Current Medications: Current Facility-Administered Medications  Medication Dose Route Frequency Provider Last Rate Last Admin   acetaminophen (TYLENOL) tablet 650 mg  650 mg Oral Q6H PRN Charm Rings, NP       alum & mag hydroxide-simeth (MAALOX/MYLANTA) 200-200-20 MG/5ML suspension 30 mL  30 mL Oral Q4H PRN Charm Rings, NP       levothyroxine (SYNTHROID) tablet 75 mcg  75 mcg Oral Q0600 Sarina Ill, DO   75 mcg at 05/25/23 7829   linagliptin (TRADJENTA) tablet 2.5 mg  2.5 mg Oral BID AC Sarina Ill, DO   2.5 mg at 05/25/23 0815   magnesium hydroxide (MILK OF MAGNESIA) suspension 30 mL  30 mL Oral Daily PRN Charm Rings, NP       metFORMIN (GLUCOPHAGE) tablet 500 mg  500 mg Oral BID WC Sarina Ill, DO   500 mg at 05/25/23 0815   OLANZapine (ZYPREXA) tablet 5 mg  5 mg Oral Daily PRN Charm Rings, NP  Or   OLANZapine (ZYPREXA) injection 5 mg  5 mg Intramuscular Daily PRN Charm Rings, NP       polyvinyl alcohol (LIQUIFILM TEARS) 1.4 % ophthalmic solution 1 drop  1 drop Both Eyes PRN Sarina Ill, DO   1 drop at 05/24/23 2150   QUEtiapine (SEROQUEL) tablet 50 mg  50 mg Oral QHS Sarina Ill, DO   50 mg at 05/24/23 2146   risperiDONE (RISPERDAL) tablet 0.5 mg  0.5 mg Oral BID Charm Rings, NP   0.5 mg at 05/25/23 1610    Lab Results: No results found for this or any previous visit (from the past 48 hour(s)).  Blood Alcohol level:  No results found for: "ETH"  Metabolic Disorder Labs: No results found for: "HGBA1C", "MPG" No results found for: "PROLACTIN" No results found for: "CHOL", "TRIG", "HDL", "CHOLHDL", "VLDL", "LDLCALC"  Physical Findings: AIMS:  , ,  ,  ,     CIWA:    COWS:     Musculoskeletal: Strength & Muscle Tone: within normal limits Gait & Station: normal Patient leans: N/A  Psychiatric Specialty Exam:  Presentation  General Appearance: No data recorded Eye Contact:No data recorded Speech:No data recorded Speech Volume:No data recorded Handedness:No data recorded  Mood and Affect  Mood:No data recorded Affect:No data recorded  Thought Process  Thought Processes:No data recorded Descriptions of Associations:No data recorded Orientation:No data recorded Thought Content:No data recorded History of Schizophrenia/Schizoaffective disorder:No  Duration of Psychotic Symptoms:Greater than six months  Hallucinations:No data recorded Ideas of Reference:No data recorded Suicidal Thoughts:No data recorded Homicidal Thoughts:No data recorded  Sensorium  Memory:No data recorded Judgment:No data recorded Insight:No data recorded  Executive Functions  Concentration:No data recorded Attention Span:No data recorded Recall:No data recorded Fund of Knowledge:No data recorded Language:No data recorded  Psychomotor Activity  Psychomotor Activity:No data recorded  Assets  Assets:No data recorded  Sleep  Sleep:No data recorded    Blood pressure 135/68, pulse 80, temperature 98.2 F (36.8 C), resp. rate 20, height 5\' 4"  (1.626 m), weight 104.8 kg, SpO2 98 %. Body mass index is 39.65 kg/m.   Treatment Plan Summary: Daily contact with patient to assess and evaluate symptoms and progress in treatment, Medication management, and Plan lipid panel and hemoglobin A1c in the morning.  Sarina Ill, DO 05/25/2023, 1:46 PM

## 2023-05-25 NOTE — Plan of Care (Signed)
Pleasant and cooperative. Pt is assertive in making her needs known. Visible on the unit. Attends group. Po med complaint. C/o dry eyes. Artifical tears given as ordered PRN. No behavior issues noted. Q 15 min checks maintained for safety.  Denies SI/HI/AVH. No c/o pain/discomfort noted.   Problem: Activity: Goal: Risk for activity intolerance will decrease Outcome: Progressing   Problem: Nutrition: Goal: Adequate nutrition will be maintained Outcome: Progressing   Problem: Coping: Goal: Level of anxiety will decrease Outcome: Progressing   Problem: Elimination: Goal: Will not experience complications related to bowel motility Outcome: Progressing Goal: Will not experience complications related to urinary retention Outcome: Progressing   Problem: Pain Managment: Goal: General experience of comfort will improve Outcome: Progressing

## 2023-05-25 NOTE — Group Note (Signed)
[facility] LCSW Group Therapy Note    Group Date: 05/25/2023 Start Time: 1315 End Time: 1350  Type of Therapy and Topic:  Group Therapy:  Overcoming Obstacles  Participation Level:  [facility] PARTICIPATION LEVEL: Minimal  Mood:  Description of Group:   In this group patients will be encouraged to explore what they see as obstacles to their own wellness and recovery. They will be guided to discuss their thoughts, feelings, and behaviors related to these obstacles. The group will process together ways to cope with barriers, with attention given to specific choices patients can make. Each patient will be challenged to identify changes they are motivated to make in order to overcome their obstacles. This group will be process-oriented, with patients participating in exploration of their own experiences as well as giving and receiving support and challenge from other group members.  Therapeutic Goals: 1. Patient will identify personal and current obstacles as they relate to admission. 2. Patient will identify barriers that currently interfere with their wellness or overcoming obstacles.  3. Patient will identify feelings, thought process and behaviors related to these barriers. 4. Patient will identify two changes they are willing to make to overcome these obstacles:    Summary of Patient Progress   Pt was following along with group hand out. Pt did not verbally contribute but was writing on her groups sheet.    Therapeutic Modalities:   Cognitive Behavioral Therapy Solution Focused Therapy Motivational Interviewing Relapse Prevention Therapy   Elza Rafter, LCSWA

## 2023-05-25 NOTE — [facility] Counselor (Signed)
CSW attempted to return call to the patient's sister.  CSW left HIPAA compliant voicemail.  Penni Homans, MSW, LCSW 05/25/2023 10:16 AM

## 2023-05-26 LAB — LIPID PANEL
Cholesterol: 187 mg/dL (ref 0–200)
HDL: 40 mg/dL — ABNORMAL LOW (ref >40)
LDL Cholesterol: 119 mg/dL — ABNORMAL HIGH (ref 0–99)
Total CHOL/HDL Ratio: 4.7 RATIO
Triglycerides: 140 mg/dL (ref <150)
VLDL: 28 mg/dL (ref 0–40)

## 2023-05-26 MED ORDER — QUETIAPINE FUMARATE 50 MG PO TABS: 50.0000 mg | ORAL_TABLET | Freq: Every day | ORAL | 0 refills | Status: DC

## 2023-05-26 MED ORDER — LEVOTHYROXINE SODIUM 75 MCG PO TABS: 75.0000 ug | ORAL_TABLET | Freq: Every day | ORAL | 0 refills | Status: DC

## 2023-05-26 MED ORDER — POLYVINYL ALCOHOL 1.4 % OP SOLN: 1.0000 [drp] | OPHTHALMIC | 0 refills | Status: AC | PRN

## 2023-05-26 MED ORDER — RISPERIDONE 0.5 MG PO TABS: 0.5000 mg | ORAL_TABLET | Freq: Two times a day (BID) | ORAL | 0 refills | Status: DC

## 2023-05-26 MED ORDER — METFORMIN HCL 500 MG PO TABS: 500.0000 mg | ORAL_TABLET | Freq: Two times a day (BID) | ORAL | 0 refills | Status: DC

## 2023-05-26 MED ORDER — LINAGLIPTIN 5 MG PO TABS: 2.5000 mg | ORAL_TABLET | Freq: Two times a day (BID) | ORAL | 1 refills | Status: DC

## 2023-05-26 NOTE — Progress Notes (Signed)
Patient is alert & oriented x 4.  She is able to voice her complaints and concerns appropriately. She was visiting with her sister in the dayroom upon this writer's arrival.   She denied SI, HI, delusions, and hallucinations.  She reported that she is "excited about going home tomorrow so, I can sleep in my King size bed".  No s/sx. of acute distress throughout the shift.  Medications taken after this Clinical research associate discussed why they prescribed.  No adverse reactions observed or reported.  She rested in bed until 0500 with closed eyes.  Respirations even and unlabored. Q15 min safety checks & plan of care in place.

## 2023-05-26 NOTE — Progress Notes (Signed)
   05/26/23 0719  Psych Admission Type (Psych Patients Only)  Admission Status Voluntary  Psychosocial Assessment  Patient Complaints None  Eye Contact Fair  Facial Expression Blank  Affect Appropriate to circumstance  Speech Logical/coherent  Interaction Assertive  Motor Activity Slow  Appearance/Hygiene Unremarkable  Behavior Characteristics Cooperative;Calm  Mood Pleasant  Thought Process  Coherency WDL  Content WDL  Delusions None reported or observed  Perception WDL  Hallucination None reported or observed  Judgment WDL  Confusion None  Danger to Self  Current suicidal ideation? Denies  Danger to Others  Danger to Others None reported or observed  Danger to Others Abnormal  Harmful Behavior to others No threats or harm toward other people

## 2023-05-26 NOTE — Progress Notes (Signed)
  [facility] Adult Case Management Discharge Plan :  Will you be returning to the same living situation after discharge:  Yes,  pt will be returning to her home  At discharge, do you have transportation home?: Yes,  pt's sister Darl Pikes will be picking her up  Do you have the ability to pay for your medications: Yes, [employer] MEDICARE / [facility] MEDICARE   Pt declined follow-up appointment, below are walk-in hours for [employer].    Follow-up Information     [facility] Follow up.   Why: Same-Day Access Hours:  Monday, Wednesday and Friday, 8am - 3pm Crisis & Diversion Center: Walk-In Crisis Hours: 7 days/week, 8am - 12am -  Musician -  Patent examiner Drop-Off (side door) Contact information: [address] [place] [address] [phone]                 Next level of care provider has access to [facility] Link:no  Safety Planning and Suicide Prevention discussed: Yes,  SPE discussed with pt's sister, Darl Pikes [phone]     Has patient been referred to the Quitline?: Patient does not use tobacco/nicotine products  Patient has been referred for addiction treatment: No known substance use disorder.  [address], LCSWA 05/26/2023, 11:27 AM

## 2023-05-26 NOTE — Group Note (Signed)
Date:  05/26/2023 Time:  12:09 PM  Group Topic/Focus:  Music Therapy/Outside Rec     Participation Level:  Active  Participation Quality:  Appropriate  Affect:  Appropriate  Cognitive:  Alert and Appropriate  Insight: Appropriate  Engagement in Group:  Engaged  Modes of Intervention:  Activity and Discussion  Additional Comments:    Marta Antu 05/26/2023, 12:09 PM

## 2023-05-26 NOTE — Care Management Important Message (Signed)
Important Message  Patient Details  Name: Pamela Benson MRN: [id number] Date of Birth: 07/05/1946   Medicare Important Message Given:   CSW discussed IM with pt. Pt declined appeal to discharge, CSW gave pt signed copy of IM in discharge packet.   [address], LCSWA 05/26/2023, 11:22 AM
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
## 2023-05-26 NOTE — BHH Suicide Risk Assessment (Signed)
[facility] Discharge Suicide Risk Assessment   Principal Problem: Acute psychosis (HCC) Discharge Diagnoses: Principal Problem:   Acute psychosis (HCC)   Total Time spent with patient: 20 minutes  Musculoskeletal: Strength & Muscle Tone: within normal limits Gait & Station: normal Patient leans: N/A  Psychiatric Specialty Exam  Presentation  General Appearance:WNL Eye Contact:Good Speech:WNL  Mood and Affect  Mood:Good Affect:Stable Thought Process  Thought Processes:Goal directed Descriptions of Associations:Intact  Hallucinations:None Ideas of Reference:None Suicidal Thoughts:None reported Homicidal Thoughts:None reported    Physical Exam: Physical Exam Constitutional:      Appearance: Normal appearance.  HENT:     Head: Normocephalic and atraumatic.     Nose: Nose normal.  Eyes:     Pupils: Pupils are equal, round, and reactive to light.  Cardiovascular:     Rate and Rhythm: Normal rate.  Neurological:     General: No focal deficit present.     Mental Status: She is alert.  Psychiatric:        Mood and Affect: Mood normal.        Behavior: Behavior normal.        Thought Content: Thought content normal.        Judgment: Judgment normal.    Review of Systems  Constitutional: Negative.   Eyes: Negative.   Respiratory: Negative.    Cardiovascular: Negative.   Skin: Negative.   Psychiatric/Behavioral: Negative.     Blood pressure (!) 131/54, pulse 63, temperature 98.4 F (36.9 C), resp. rate 15, height 5\' 4"  (1.626 m), weight 104.8 kg, SpO2 98 %. Body mass index is 39.65 kg/m.   Demographic Factors:  Caucasian  Loss Factors: Decline in physical health and Financial problems/change in socioeconomic status  Historical Factors: NA  Risk Reduction Factors:   Religious beliefs about death, Positive therapeutic relationship, and Positive coping skills or problem solving skills  Continued Clinical Symptoms:  More than one psychiatric  diagnosis  Cognitive Features That Contribute To Risk:  None    Suicide Risk:  Minimal: No identifiable suicidal ideation.  Patients presenting with no risk factors but with morbid ruminations; may be classified as minimal risk based on the severity of the depressive symptoms   Follow-up Information     [facility] Follow up.   Why: Same-Day Access Hours:  Monday, Wednesday and Friday, 8am - 3pm Crisis & Diversion Center: Walk-In Crisis Hours: 7 days/week, 8am - 12am -  Musician -  Patent examiner Drop-Off (side door) Contact information: [address] [place] [address] [phone]                 Plan Of Care/Follow-up recommendations:  Activity:  As tolerated Diet:  Diabetic/cardiac diet  Lewanda Rife, MD 05/26/2023, 12:57 PM

## 2023-05-26 NOTE — Group Note (Signed)
Date:  05/26/2023 Time:  5:37 AM  Group Topic/Focus:  Conflict Resolution:   The focus of this group is to discuss the conflict resolution process and how it may be used upon discharge.    Participation Level:  Did Not Attend  Participation Quality:   Did Not Attend  Affect:   Did Not Attend  Cognitive:   Did Not Attend  Insight: None  Engagement in Group:  None  Modes of Intervention:   Did Not Attend  Additional Comments:    [name] 05/26/2023, 5:37 AM

## 2023-05-27 LAB — HEMOGLOBIN A1C
Hgb A1c MFr Bld: 6.8 % — ABNORMAL HIGH (ref 4.8–5.6)
Mean Plasma Glucose: 148 mg/dL

## 2023-07-19 ENCOUNTER — Inpatient Hospital Stay
Admission: AD | Admit: 2023-07-19 | Discharge: 2023-07-24 | DRG: 885 | Disposition: A | Discharge status: Home / Self Care | Payer: Medicare Other | Source: Intra-hospital | Attending: Psychiatry | Admitting: Psychiatry

## 2023-07-19 ENCOUNTER — Encounter: Payer: Self-pay | Admitting: Family

## 2023-07-19 ENCOUNTER — Emergency Department
Admission: EM | Admit: 2023-07-19 | Discharge: 2023-07-19 | Disposition: A | Discharge status: Other Acute Inpatient Hospital | Payer: Medicare Other | Source: Home / Self Care | Attending: Student in an Organized Health Care Education/Training Program | Admitting: Student in an Organized Health Care Education/Training Program

## 2023-07-19 ENCOUNTER — Other Ambulatory Visit: Payer: Self-pay

## 2023-07-19 DIAGNOSIS — F29 Unspecified psychosis not due to a substance or known physiological condition: Secondary | ICD-10-CM | POA: Diagnosis present

## 2023-07-19 DIAGNOSIS — B9620 Unspecified Escherichia coli [E. coli] as the cause of diseases classified elsewhere: Secondary | ICD-10-CM | POA: Diagnosis present

## 2023-07-19 DIAGNOSIS — F200 Paranoid schizophrenia: Secondary | ICD-10-CM | POA: Diagnosis present

## 2023-07-19 DIAGNOSIS — F23 Brief psychotic disorder: Secondary | ICD-10-CM | POA: Diagnosis not present

## 2023-07-19 DIAGNOSIS — Z7983 Long term (current) use of bisphosphonates: Secondary | ICD-10-CM

## 2023-07-19 DIAGNOSIS — N390 Urinary tract infection, site not specified: Secondary | ICD-10-CM | POA: Diagnosis present

## 2023-07-19 DIAGNOSIS — F22 Delusional disorders: Secondary | ICD-10-CM | POA: Insufficient documentation

## 2023-07-19 DIAGNOSIS — Z79890 Hormone replacement therapy (postmenopausal): Secondary | ICD-10-CM | POA: Diagnosis not present

## 2023-07-19 DIAGNOSIS — Z7984 Long term (current) use of oral hypoglycemic drugs: Secondary | ICD-10-CM

## 2023-07-19 LAB — CBC
HCT: 39.8 % (ref 36.0–46.0)
Hemoglobin: 12.3 g/dL (ref 12.0–15.0)
MCH: 27.8 pg (ref 26.0–34.0)
MCHC: 30.9 g/dL (ref 30.0–36.0)
MCV: 90 fL (ref 80.0–100.0)
Platelets: 317 10*3/uL (ref 150–400)
RBC: 4.42 MIL/uL (ref 3.87–5.11)
RDW: 15.6 % — ABNORMAL HIGH (ref 11.5–15.5)
WBC: 10.7 10*3/uL — ABNORMAL HIGH (ref 4.0–10.5)
nRBC: 0 % (ref 0.0–0.2)

## 2023-07-19 LAB — COMPREHENSIVE METABOLIC PANEL
ALT: 14 U/L (ref 0–44)
AST: 19 U/L (ref 15–41)
Albumin: 3.8 g/dL (ref 3.5–5.0)
Alkaline Phosphatase: 80 U/L (ref 38–126)
Anion gap: 8 (ref 5–15)
BUN: 19 mg/dL (ref 8–23)
CO2: 25 mmol/L (ref 22–32)
Calcium: 8.9 mg/dL (ref 8.9–10.3)
Chloride: 103 mmol/L (ref 98–111)
Creatinine, Ser: 1.4 mg/dL — ABNORMAL HIGH (ref 0.44–1.00)
GFR, Estimated: 39 mL/min — ABNORMAL LOW (ref >60)
Glucose, Bld: 168 mg/dL — ABNORMAL HIGH (ref 70–99)
Potassium: 3.4 mmol/L — ABNORMAL LOW (ref 3.5–5.1)
Sodium: 136 mmol/L (ref 135–145)
Total Bilirubin: 0.4 mg/dL (ref 0.3–1.2)
Total Protein: 8 g/dL (ref 6.5–8.1)

## 2023-07-19 LAB — URINALYSIS, W/ REFLEX TO CULTURE (INFECTION SUSPECTED)
Bilirubin Urine: NEGATIVE
Glucose, UA: >500 mg/dL — AB
Ketones, ur: NEGATIVE mg/dL
Leukocytes,Ua: NEGATIVE
Nitrite: NEGATIVE
Protein, ur: NEGATIVE mg/dL
Specific Gravity, Urine: 1.015 (ref 1.005–1.030)
pH: 5.5 (ref 5.0–8.0)

## 2023-07-19 LAB — URINE DRUG SCREEN, QUALITATIVE (ARMC ONLY)
Amphetamines, Ur Screen: NOT DETECTED
Barbiturates, Ur Screen: NOT DETECTED
Benzodiazepine, Ur Scrn: NOT DETECTED
Cannabinoid 50 Ng, Ur ~~LOC~~: NOT DETECTED
Cocaine Metabolite,Ur ~~LOC~~: NOT DETECTED
MDMA (Ecstasy)Ur Screen: NOT DETECTED
Methadone Scn, Ur: NOT DETECTED
Opiate, Ur Screen: NOT DETECTED
Phencyclidine (PCP) Ur S: NOT DETECTED
Tricyclic, Ur Screen: NOT DETECTED

## 2023-07-19 LAB — ACETAMINOPHEN LEVEL: Acetaminophen (Tylenol), Serum: <10 ug/mL — ABNORMAL LOW (ref 10–30)

## 2023-07-19 LAB — ETHANOL: Alcohol, Ethyl (B): <10 mg/dL (ref <10)

## 2023-07-19 LAB — TSH: TSH: 1.927 u[IU]/mL (ref 0.350–4.500)

## 2023-07-19 LAB — T4, FREE: Free T4: 1.26 ng/dL — ABNORMAL HIGH (ref 0.61–1.12)

## 2023-07-19 LAB — SALICYLATE LEVEL: Salicylate Lvl: <7 mg/dL — ABNORMAL LOW (ref 7.0–30.0)

## 2023-07-19 MED ORDER — METFORMIN HCL 500 MG PO TABS
500.0000 mg | ORAL_TABLET | Freq: Two times a day (BID) | ORAL | Status: DC
  Administered 2023-07-20 – 2023-07-21 (×3): 500 mg via ORAL
  Filled 2023-07-19 (×3): qty 1

## 2023-07-19 MED ORDER — LEVOTHYROXINE SODIUM 75 MCG PO TABS
75.0000 ug | ORAL_TABLET | Freq: Every day | ORAL | Status: DC
  Administered 2023-07-20 – 2023-07-24 (×5): 75 ug via ORAL
  Filled 2023-07-19 (×5): qty 1

## 2023-07-19 MED ORDER — MAGNESIUM HYDROXIDE 400 MG/5ML PO SUSP: 30.0000 mL | Freq: Every day | ORAL | Status: DC | PRN

## 2023-07-19 MED ORDER — METFORMIN HCL 500 MG PO TABS
500.0000 mg | ORAL_TABLET | Freq: Two times a day (BID) | ORAL | Status: DC
  Administered 2023-07-19: 500 mg via ORAL
  Filled 2023-07-19: qty 1

## 2023-07-19 MED ORDER — LINAGLIPTIN 5 MG PO TABS
2.5000 mg | ORAL_TABLET | Freq: Two times a day (BID) | ORAL | Status: DC
  Administered 2023-07-19: 2.5 mg via ORAL
  Filled 2023-07-19: qty 1

## 2023-07-19 MED ORDER — ALUM & MAG HYDROXIDE-SIMETH 200-200-20 MG/5ML PO SUSP: 30.0000 mL | ORAL | Status: DC | PRN

## 2023-07-19 MED ORDER — HALOPERIDOL 5 MG PO TABS: 5.0000 mg | ORAL_TABLET | Freq: Three times a day (TID) | ORAL | Status: DC | PRN

## 2023-07-19 MED ORDER — LORAZEPAM 2 MG/ML IJ SOLN: 2.0000 mg | Freq: Three times a day (TID) | INTRAMUSCULAR | Status: DC | PRN

## 2023-07-19 MED ORDER — LEVOTHYROXINE SODIUM 50 MCG PO TABS: 75.0000 ug | ORAL_TABLET | Freq: Every day | ORAL | Status: DC

## 2023-07-19 MED ORDER — FOSFOMYCIN TROMETHAMINE 3 G PO PACK
3.0000 g | PACK | Freq: Once | ORAL | Status: AC
  Administered 2023-07-19: 3 g via ORAL
  Filled 2023-07-19: qty 3

## 2023-07-19 MED ORDER — EMPAGLIFLOZIN 10 MG PO TABS
10.0000 mg | ORAL_TABLET | Freq: Every morning | ORAL | Status: DC
  Administered 2023-07-20 – 2023-07-24 (×5): 10 mg via ORAL
  Filled 2023-07-19 (×5): qty 1

## 2023-07-19 MED ORDER — ACETAMINOPHEN 325 MG PO TABS: 650.0000 mg | ORAL_TABLET | Freq: Four times a day (QID) | ORAL | Status: DC | PRN

## 2023-07-19 MED ORDER — EMPAGLIFLOZIN 10 MG PO TABS
10.0000 mg | ORAL_TABLET | Freq: Every morning | ORAL | Status: DC
  Administered 2023-07-19: 10 mg via ORAL
  Filled 2023-07-19: qty 1

## 2023-07-19 MED ORDER — DIPHENHYDRAMINE HCL 25 MG PO CAPS: 50.0000 mg | ORAL_CAPSULE | Freq: Three times a day (TID) | ORAL | Status: DC | PRN

## 2023-07-19 MED ORDER — HALOPERIDOL LACTATE 5 MG/ML IJ SOLN: 5.0000 mg | Freq: Three times a day (TID) | INTRAMUSCULAR | Status: DC | PRN

## 2023-07-19 MED ORDER — QUETIAPINE FUMARATE 25 MG PO TABS
50.0000 mg | ORAL_TABLET | Freq: Every day | ORAL | Status: DC
  Administered 2023-07-19 – 2023-07-20 (×2): 50 mg via ORAL
  Filled 2023-07-19 (×2): qty 2

## 2023-07-19 MED ORDER — LINAGLIPTIN 5 MG PO TABS
2.5000 mg | ORAL_TABLET | Freq: Two times a day (BID) | ORAL | Status: DC
  Administered 2023-07-20 – 2023-07-24 (×9): 2.5 mg via ORAL
  Filled 2023-07-19 (×9): qty 1

## 2023-07-19 MED ORDER — LORAZEPAM 1 MG PO TABS: 2.0000 mg | ORAL_TABLET | Freq: Three times a day (TID) | ORAL | Status: DC | PRN

## 2023-07-19 MED ORDER — QUETIAPINE FUMARATE 25 MG PO TABS: 50.0000 mg | ORAL_TABLET | Freq: Every day | ORAL | Status: DC

## 2023-07-19 MED ORDER — RISPERIDONE 1 MG PO TABS
0.5000 mg | ORAL_TABLET | Freq: Two times a day (BID) | ORAL | Status: DC
  Administered 2023-07-19 – 2023-07-21 (×4): 0.5 mg via ORAL
  Filled 2023-07-19 (×4): qty 1

## 2023-07-19 MED ORDER — DIPHENHYDRAMINE HCL 50 MG/ML IJ SOLN: 50.0000 mg | Freq: Three times a day (TID) | INTRAMUSCULAR | Status: DC | PRN

## 2023-07-19 MED ORDER — RISPERIDONE 1 MG PO TABS
0.5000 mg | ORAL_TABLET | Freq: Two times a day (BID) | ORAL | Status: DC
  Administered 2023-07-19: 0.5 mg via ORAL
  Filled 2023-07-19: qty 1

## 2023-07-19 NOTE — Group Note (Signed)
Date:  07/19/2023 Time:  10:21 PM  Group Topic/Focus:  Goals Group:   The focus of this group is to help patients establish daily goals to achieve during treatment and discuss how the patient can incorporate goal setting into their daily lives to aide in recovery.    Participation Level:  Did Not Attend  Participation Quality:      Affect:      Cognitive:      Insight: None  Engagement in Group:  None  Modes of Intervention:      Additional Comments:    Maeola Harman 07/19/2023, 10:21 PM

## 2023-07-19 NOTE — ED Notes (Signed)
IVC/pt accepted to [facility] Rm 26 after 2030

## 2023-07-19 NOTE — Plan of Care (Signed)
?  Problem: Clinical Measurements: ?Goal: Will remain free from infection ?Outcome: Progressing ?  ?
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
## 2023-07-19 NOTE — ED Notes (Addendum)
Pt belongings:  1 navy pajama shirt 1 pair of navy pajama pants 1 pair of gray sandals 1 brown purse 1 white bra 1 pair of pink underwear

## 2023-07-19 NOTE — ED Notes (Signed)
Food tray given 
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
## 2023-07-19 NOTE — ED Notes (Signed)
IVC, pending psych consult 

## 2023-07-20 DIAGNOSIS — F23 Brief psychotic disorder: Secondary | ICD-10-CM
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
## 2023-07-20 NOTE — Progress Notes (Signed)
Nursing Shift Note:  1900-0700  Attended Evening Group: yes Medication Compliant:  yes Behavior: cooperative and pleasant Sleep Quality: good Significant Changes: none noted   07/20/23 2200  Psych Admission Type (Psych Patients Only)  Admission Status Involuntary  Psychosocial Assessment  Patient Complaints None  Eye Contact Fair  Facial Expression Other (Comment)  Affect Appropriate to circumstance  Speech Logical/coherent  Interaction Assertive  Motor Activity Slow  Appearance/Hygiene Unremarkable  Behavior Characteristics Cooperative  Mood Pleasant  Thought Process  Coherency WDL  Content WDL  Delusions None reported or observed  Perception WDL  Hallucination None reported or observed  Judgment Impaired  Confusion None  Danger to Self  Current suicidal ideation? Denies  Danger to Others  Danger to Others None reported or observed  Danger to Others Abnormal  Harmful Behavior to others No threats or harm toward other people  Destructive Behavior No threats or harm toward property

## 2023-07-20 NOTE — BH IP Treatment Plan (Signed)
Interdisciplinary Treatment and Diagnostic Plan Update  07/20/2023 Time of Session: 10:20AM Pamela Benson MRN: [id number]  Principal Diagnosis: Acute psychosis [facility])  Secondary Diagnoses: Principal Problem:   Acute psychosis (HCC)   Current Medications:  Current Facility-Administered Medications  Medication Dose Route Frequency Provider Last Rate Last Admin   acetaminophen (TYLENOL) tablet 650 mg  650 mg Oral Q6H PRN Starkes-Perry, Juel Burrow, FNP       alum & mag hydroxide-simeth (MAALOX/MYLANTA) 200-200-20 MG/5ML suspension 30 mL  30 mL Oral Q4H PRN Starkes-Perry, Juel Burrow, FNP       diphenhydrAMINE (BENADRYL) capsule 50 mg  50 mg Oral TID PRN Maryagnes Amos, FNP       Or   diphenhydrAMINE (BENADRYL) injection 50 mg  50 mg Intramuscular TID PRN Starkes-Perry, Juel Burrow, FNP       empagliflozin (JARDIANCE) tablet 10 mg  10 mg Oral q morning Maryagnes Amos, FNP   10 mg at 07/20/23 1005   haloperidol (HALDOL) tablet 5 mg  5 mg Oral TID PRN Maryagnes Amos, FNP       Or   haloperidol lactate (HALDOL) injection 5 mg  5 mg Intramuscular TID PRN Maryagnes Amos, FNP       levothyroxine (SYNTHROID) tablet 75 mcg  75 mcg Oral Q0600 Maryagnes Amos, FNP   75 mcg at 07/20/23 5784   linagliptin (TRADJENTA) tablet 2.5 mg  2.5 mg Oral BID AC Maryagnes Amos, FNP   2.5 mg at 07/20/23 0737   LORazepam (ATIVAN) tablet 2 mg  2 mg Oral TID PRN Maryagnes Amos, FNP       Or   LORazepam (ATIVAN) injection 2 mg  2 mg Intramuscular TID PRN Starkes-Perry, Juel Burrow, FNP       magnesium hydroxide (MILK OF MAGNESIA) suspension 30 mL  30 mL Oral Daily PRN Starkes-Perry, Juel Burrow, FNP       metFORMIN (GLUCOPHAGE) tablet 500 mg  500 mg Oral BID WC Maryagnes Amos, FNP   500 mg at 07/20/23 0738   QUEtiapine (SEROQUEL) tablet 50 mg  50 mg Oral QHS Maryagnes Amos, FNP   50 mg at 07/19/23 2148   risperiDONE (RISPERDAL) tablet 0.5 mg  0.5 mg Oral BID  Maryagnes Amos, FNP   0.5 mg at 07/20/23 1003   PTA Medications: Medications Prior to Admission  Medication Sig Dispense Refill Last Dose   alendronate (FOSAMAX) 70 MG tablet Take 70 mg by mouth once a week.      furosemide (LASIX) 20 MG tablet Take 20 mg by mouth daily.      JARDIANCE 10 MG TABS tablet Take 10 mg by mouth every morning.      JENTADUETO 2.5-500 MG TABS Take 1 tablet by mouth daily.      levothyroxine (SYNTHROID) 75 MCG tablet Take 1 tablet (75 mcg total) by mouth daily at 6 (six) AM. 30 tablet 0    linagliptin (TRADJENTA) 5 MG TABS tablet Take 0.5 tablets (2.5 mg total) by mouth 2 (two) times daily before a meal. 30 tablet 1    metFORMIN (GLUCOPHAGE) 500 MG tablet Take 1 tablet (500 mg total) by mouth 2 (two) times daily with a meal. 60 tablet 0    polyvinyl alcohol (LIQUIFILM TEARS) 1.4 % ophthalmic solution Place 1 drop into both eyes as needed for dry eyes. 15 mL 0    QUEtiapine (SEROQUEL) 50 MG tablet Take 1 tablet (50 mg total) by mouth at bedtime.  30 tablet 0    risperiDONE (RISPERDAL) 0.5 MG tablet Take 1 tablet (0.5 mg total) by mouth 2 (two) times daily. 60 tablet 0     Patient Stressors: Medication change or noncompliance    Patient Strengths: Active sense of humor  Supportive family/friends   Treatment Modalities: Medication Management, Group therapy, Case management,  1 to 1 session with clinician, Psychoeducation, Recreational therapy.   Physician Treatment Plan for Primary Diagnosis: Acute psychosis (HCC) Long Term Goal(s):     Short Term Goals:    Medication Management: Evaluate patient's response, side effects, and tolerance of medication regimen.  Therapeutic Interventions: 1 to 1 sessions, Unit Group sessions and Medication administration.  Evaluation of Outcomes: Progressing  Physician Treatment Plan for Secondary Diagnosis: Principal Problem:   Acute psychosis (HCC)  Long Term Goal(s):     Short Term Goals:       Medication  Management: Evaluate patient's response, side effects, and tolerance of medication regimen.  Therapeutic Interventions: 1 to 1 sessions, Unit Group sessions and Medication administration.  Evaluation of Outcomes: Progressing   RN Treatment Plan for Primary Diagnosis: Acute psychosis (HCC) Long Term Goal(s): Knowledge of disease and therapeutic regimen to maintain health will improve  Short Term Goals: Ability to remain free from injury will improve, Ability to verbalize frustration and anger appropriately will improve, Ability to demonstrate self-control, Ability to participate in decision making will improve, Ability to verbalize feelings will improve, Ability to disclose and discuss suicidal ideas, Ability to identify and develop effective coping behaviors will improve, and Compliance with prescribed medications will improve  Medication Management: RN will administer medications as ordered by provider, will assess and evaluate patient's response and provide education to patient for prescribed medication. RN will report any adverse and/or side effects to prescribing provider.  Therapeutic Interventions: 1 on 1 counseling sessions, Psychoeducation, Medication administration, Evaluate responses to treatment, Monitor vital signs and CBGs as ordered, Perform/monitor CIWA, COWS, AIMS and Fall Risk screenings as ordered, Perform wound care treatments as ordered.  Evaluation of Outcomes: Progressing   LCSW Treatment Plan for Primary Diagnosis: Acute psychosis (HCC) Long Term Goal(s): Safe transition to appropriate next level of care at discharge, Engage patient in therapeutic group addressing interpersonal concerns.  Short Term Goals: Engage patient in aftercare planning with referrals and resources, Increase social support, Increase ability to appropriately verbalize feelings, Increase emotional regulation, Facilitate acceptance of mental health diagnosis and concerns, and Facilitate patient  progression through stages of change regarding substance use diagnoses and concerns  Therapeutic Interventions: Assess for all discharge needs, 1 to 1 time with Social worker, Explore available resources and support systems, Assess for adequacy in community support network, Educate family and significant other(s) on suicide prevention, Complete Psychosocial Assessment, Interpersonal group therapy.  Evaluation of Outcomes: Progressing   Progress in Treatment: Attending groups: No. Participating in groups: No. Taking medication as prescribed: Yes. Toleration medication: Yes. Family/Significant other contact made: No, will contact:  CSW will contact if given permission. Patient understands diagnosis: Yes. Discussing patient identified problems/goals with staff: Yes. Medical problems stabilized or resolved: Yes. Denies suicidal/homicidal ideation: Yes. Issues/concerns per patient self-inventory: No. Other: None  New problem(s) identified: No, Describe:  None  New Short Term/Long Term Goal(s): Elimination of symptoms of psychosis, medication management for mood stabilization; elimination of SI thoughts; development of comprehensive mental wellness plan.  Patient Goals:  "I want to get out of here and go home."  Discharge Plan or Barriers: CSW will assist with appropriate discharge planning.  Reason for Continuation of Hospitalization: Medication stabilization  Estimated Length of Stay: 1-7 days  Last 3 [place] Suicide Severity Risk Score: Flowsheet Row Admission (Current) from 07/19/2023 in [facility] BE[facility]AL [facility] Most recent reading at 07/19/2023  9:00 PM ED from 07/19/2023 in [facility] at [facility] Most recent reading at 07/19/2023  4:09 PM Admission (Discharged) from 05/21/2023 in [facility] BEHAVIORAL MEDICINE Most recent reading at 05/22/2023 10:00 AM  C-SSRS RISK CATEGORY No Risk No Risk No Risk       Last PHQ 2/9 Scores:     No data to  display          Scribe for Treatment Team: Rhett Bannister 07/20/2023 11:04 AM

## 2023-07-20 NOTE — Progress Notes (Signed)
Patient is IVC'd to the [facility] unit yesterday for psychosis.  She was here approx 2 months ago and discharged with medication but when it ran out after 30 days, she didn't have any refills or seem to know how to get them refilled.  She denies S/I, H/I, endorses auditory "sounds" at times. Denies depression and anxiety but appears somewhat anxious.  States she just wants to get back on her medication so she can "go home and get back to her life". She interacts well with peers and staff.  Ambulates with slow steady gait. Performs her own ADL's.

## 2023-07-20 NOTE — H&P (Signed)
Psychiatric Admission Assessment Adult  Patient Identification: Pamela Benson MRN:  [id number] Date of Evaluation:  07/20/2023 Chief Complaint:  Acute psychosis (HCC) [F23] Principal Diagnosis: Acute psychosis (HCC) Diagnosis:  Principal Problem:   Acute psychosis (HCC)  History of Present Illness: Pamela Benson is a 77 year old white female who presents to [facility] on an involuntary basis for a similar presentation for which she was admitted proximately 2 months ago.  She ran out of her medications and was not sleeping and became very paranoid and locked herself into the bathroom thinking that people were trying to break into her house.  She had been calling the police numerous times.  Her nephew became very alarmed and went to the Magistrate and took out commitment papers to get her back in the hospital and on medications.  She says that she did well on Risperdal and Seroquel but did not have any refills.  She is willing to get back on medications because she wants to go home as soon as possible.  She currently lives alone in [place] but does have family close by in [place].  No history of substance abuse.  She denied depression or suicidal ideation.  She does admit to hearing things but not necessarily command hallucinations.  Associated Signs/Symptoms: Depression Symptoms:  insomnia, (Hypo) Manic Symptoms:  Delusions, Anxiety Symptoms:  Excessive Worry, Psychotic Symptoms:  Delusions, PTSD Symptoms: NA Total Time spent with patient: 1 hour  Past Psychiatric History: 1 previous psychiatric admission for a similar presentation 2 months ago and she was supposed to follow-up at [facility].  Is the patient at risk to self? Yes.    Has the patient been a risk to self in the past 6 months? Yes.    Has the patient been a risk to self within the distant past? Yes.    Is the patient a risk to others? No.  Has the patient been a risk to others in the past 6 months? No.  Has the patient been a risk to others  within the distant past? No.   [place] Scale:  Flowsheet Row Admission (Current) from 07/19/2023 in [facility] BEHAVIORAL MEDICINE Most recent reading at 07/19/2023  9:00 PM ED from 07/19/2023 in [facility] Emergency Department at [facility] Most recent reading at 07/19/2023  4:09 PM Admission (Discharged) from 05/21/2023 in [facility] BEHAVIORAL MEDICINE Most recent reading at 05/22/2023 10:00 AM  C-SSRS RISK CATEGORY No Risk No Risk No Risk        Prior Inpatient Therapy: Yes.   If yes, describe  Prior Outpatient Therapy: No. If yes, describe   Alcohol Screening: 1. How often do you have a drink containing alcohol?: Never 2. How many drinks containing alcohol do you have on a typical day when you are drinking?: 1 or 2 3. How often do you have six or more drinks on one occasion?: Never AUDIT-C Score: 0 4. How often during the last year have you found that you were not able to stop drinking once you had started?: Never 5. How often during the last year have you failed to do what was normally expected from you because of drinking?: Never 6. How often during the last year have you needed a first drink in the morning to get yourself going after a heavy drinking session?: Never 7. How often during the last year have you had a feeling of guilt of remorse after drinking?: Never 8. How often during the last year have you been unable to remember what happened  the night before because you had been drinking?: Never 9. Have you or someone else been injured as a result of your drinking?: No 10. Has a relative or friend or a doctor or another health worker been concerned about your drinking or suggested you cut down?: No Alcohol Use Disorder Identification Test Final Score (AUDIT): 0 Alcohol Brief Interventions/Follow-up: Alcohol education/Brief advice Substance Abuse History in the last 12 months:  No. Consequences of Substance Abuse: NA Previous Psychotropic Medications: Yes  Psychological  Evaluations: Yes  Past Medical History: History reviewed. No pertinent past medical history. History reviewed. No pertinent surgical history. Family History: History reviewed. No pertinent family history. Family Psychiatric  History: Unremarkable Tobacco Screening:  Social History   Tobacco Use  Smoking Status Never  Smokeless Tobacco Never    BH Tobacco Counseling     Are you interested in Tobacco Cessation Medications?  No value filed. Counseled patient on smoking cessation:  No value filed. Reason Tobacco Screening Not Completed: No value filed.       Social History:  Social History   Substance and Sexual Activity  Alcohol Use Not Currently     Social History   Substance and Sexual Activity  Drug Use Not on file    Additional Social History:                           Allergies:  No Known Allergies Lab Results:  Results for orders placed or performed during the hospital encounter of 07/19/23 (from the past 48 hour(s))  Comprehensive metabolic panel     Status: Abnormal   Collection Time: 07/19/23  9:33 AM  Result Value Ref Range   Sodium 136 135 - 145 mmol/L   Potassium 3.4 (L) 3.5 - 5.1 mmol/L   Chloride 103 98 - 111 mmol/L   CO2 25 22 - 32 mmol/L   Glucose, Bld 168 (H) 70 - 99 mg/dL    Comment: Glucose reference range applies only to samples taken after fasting for at least 8 hours.   BUN 19 8 - 23 mg/dL   Creatinine, Ser 1.61 (H) 0.44 - 1.00 mg/dL   Calcium 8.9 8.9 - 09.6 mg/dL   Total Protein 8.0 6.5 - 8.1 g/dL   Albumin 3.8 3.5 - 5.0 g/dL   AST 19 15 - 41 U/L   ALT 14 0 - 44 U/L   Alkaline Phosphatase 80 38 - 126 U/L   Total Bilirubin 0.4 0.3 - 1.2 mg/dL   GFR, Estimated 39 (L) >60 mL/min    Comment: (NOTE) Calculated using the CKD-EPI Creatinine Equation (2021)    Anion gap 8 5 - 15    Comment: Performed at [facility], [address] [place], [place] [address]  Ethanol     Status: None   Collection Time: 07/19/23  9:33 AM   Result Value Ref Range   Alcohol, Ethyl (B) <10 <10 mg/dL    Comment: (NOTE) Lowest detectable limit for serum alcohol is 10 mg/dL.  For medical purposes only. Performed at [facility], [address] [place], [place] [address]   Salicylate level     Status: Abnormal   Collection Time: 07/19/23  9:33 AM  Result Value Ref Range   Salicylate Lvl <7.0 (L) 7.0 - 30.0 mg/dL    Comment: Performed at [facility], [address], [place], [place] [address]  Acetaminophen level     Status: Abnormal   Collection Time: 07/19/23  9:33 AM  Result Value Ref Range   Acetaminophen (Tylenol), Serum <10 (L) 10 - 30 ug/mL    Comment: (NOTE) Therapeutic concentrations vary significantly. A range of 10-30 ug/mL  may be an effective concentration for many patients. However, some  are best treated at concentrations outside of this range. Acetaminophen concentrations >150 ug/mL at 4 hours after ingestion  and >50 ug/mL at 12 hours after ingestion are often associated with  toxic reactions.  Performed at [facility], [address] [place], [place] [address]   cbc     Status: Abnormal   Collection Time: 07/19/23  9:33 AM  Result Value Ref Range   WBC 10.7 (H) 4.0 - 10.5 K/uL   RBC 4.42 3.87 - 5.11 MIL/uL   Hemoglobin 12.3 12.0 - 15.0 g/dL   HCT 82.9 56.2 - 13.0 %   MCV 90.0 80.0 - 100.0 fL   MCH 27.8 26.0 - 34.0 pg   MCHC 30.9 30.0 - 36.0 g/dL   RDW 86.5 (H) 78.4 - 69.6 %   Platelets 317 150 - 400 K/uL   nRBC 0.0 0.0 - 0.2 %    Comment: Performed at [facility], [address], [place], [place] [address]  Urine Drug Screen, Qualitative     Status: None   Collection Time: 07/19/23  9:33 AM  Result Value Ref Range   Tricyclic, Ur Screen NONE DETECTED NONE DETECTED   Amphetamines, Ur Screen NONE DETECTED NONE DETECTED   MDMA (Ecstasy)Ur Screen NONE DETECTED NONE DETECTED   Cocaine Metabolite,Ur [place] NONE DETECTED NONE DETECTED   Opiate, Ur Screen  NONE DETECTED NONE DETECTED   Phencyclidine (PCP) Ur S NONE DETECTED NONE DETECTED   Cannabinoid 50 Ng, Ur [place] NONE DETECTED NONE DETECTED   Barbiturates, Ur Screen NONE DETECTED NONE DETECTED   Benzodiazepine, Ur Scrn NONE DETECTED NONE DETECTED   Methadone Scn, Ur NONE DETECTED NONE DETECTED    Comment: (NOTE) Tricyclics + metabolites, urine    Cutoff 1000 ng/mL Amphetamines + metabolites, urine  Cutoff 1000 ng/mL MDMA (Ecstasy), urine              Cutoff 500 ng/mL Cocaine Metabolite, urine          Cutoff 300 ng/mL Opiate + metabolites, urine        Cutoff 300 ng/mL Phencyclidine (PCP), urine         Cutoff 25 ng/mL Cannabinoid, urine                 Cutoff 50 ng/mL Barbiturates + metabolites, urine  Cutoff 200 ng/mL Benzodiazepine, urine              Cutoff 200 ng/mL Methadone, urine                   Cutoff 300 ng/mL  The urine drug screen provides only a preliminary, unconfirmed analytical test result and should not be used for non-medical purposes. Clinical consideration and professional judgment should be applied to any positive drug screen result due to possible interfering substances. A more specific alternate chemical method must be used in order to obtain a confirmed analytical result. Gas chromatography / mass spectrometry (GC/MS) is the preferred confirm atory method. Performed at [facility], [address] [place], [place] [address]   Urinalysis, w/ Reflex to Culture (Infection Suspected) -Urine, Clean Catch     Status: Abnormal   Collection Time: 07/19/23  9:33 AM  Result Value Ref Range   Specimen Source  URINE, CLEAN CATCH    Color, Urine YELLOW YELLOW   APPearance CLEAR (A) CLEAR   Specific Gravity, Urine 1.015 1.005 - 1.030   pH 5.5 5.0 - 8.0   Glucose, UA >500 (A) NEGATIVE mg/dL   Hgb urine dipstick TRACE (A) NEGATIVE   Bilirubin Urine NEGATIVE NEGATIVE   Ketones, ur NEGATIVE NEGATIVE mg/dL   Protein, ur NEGATIVE NEGATIVE mg/dL   Nitrite  NEGATIVE NEGATIVE   Leukocytes,Ua NEGATIVE NEGATIVE   RBC / HPF 0-5 0 - 5 RBC/hpf   WBC, UA 11-20 0 - 5 WBC/hpf    Comment:        Reflex urine culture not performed if WBC <=10, OR if Squamous epithelial cells >5. If Squamous epithelial cells >5 suggest recollection.    Bacteria, UA MANY (A) NONE SEEN   Squamous Epithelial / HPF 0-5 0 - 5 /HPF    Comment: Performed at [facility], [address] [place], [place] [address]  TSH     Status: None   Collection Time: 07/19/23  9:33 AM  Result Value Ref Range   TSH 1.927 0.350 - 4.500 uIU/mL    Comment: Performed by a 3rd Generation assay with a functional sensitivity of <=0.01 uIU/mL. Performed at [facility], [address] [place], [place] [address]   T4, free     Status: Abnormal   Collection Time: 07/19/23  9:33 AM  Result Value Ref Range   Free T4 1.26 (H) 0.61 - 1.12 ng/dL    Comment: (NOTE) Biotin ingestion may interfere with free T4 tests. If the results are inconsistent with the TSH level, previous test results, or the clinical presentation, then consider biotin interference. If needed, order repeat testing after stopping biotin. Performed at [facility], [address] [place], [place] [address]     Blood Alcohol level:  Lab Results  Component Value Date   [facility] <10 07/19/2023    Metabolic Disorder Labs:  Lab Results  Component Value Date   HGBA1C 6.8 (H) 05/26/2023   MPG 148 05/26/2023   No results found for: "PROLACTIN" Lab Results  Component Value Date   CHOL 187 05/26/2023   TRIG 140 05/26/2023   HDL 40 (L) 05/26/2023   CHOLHDL 4.7 05/26/2023   VLDL 28 05/26/2023   LDLCALC 119 (H) 05/26/2023    Current Medications: Current Facility-Administered Medications  Medication Dose Route Frequency Provider Last Rate Last Admin   acetaminophen (TYLENOL) tablet 650 mg  650 mg Oral Q6H PRN Starkes-Perry, Juel Burrow, FNP       alum & mag hydroxide-simeth (MAALOX/MYLANTA)  200-200-20 MG/5ML suspension 30 mL  30 mL Oral Q4H PRN Starkes-Perry, Juel Burrow, FNP       diphenhydrAMINE (BENADRYL) capsule 50 mg  50 mg Oral TID PRN Maryagnes Amos, FNP       Or   diphenhydrAMINE (BENADRYL) injection 50 mg  50 mg Intramuscular TID PRN Starkes-Perry, Juel Burrow, FNP       empagliflozin (JARDIANCE) tablet 10 mg  10 mg Oral q morning Maryagnes Amos, FNP   10 mg at 07/20/23 1005   haloperidol (HALDOL) tablet 5 mg  5 mg Oral TID PRN Maryagnes Amos, FNP       Or   haloperidol lactate (HALDOL) injection 5 mg  5 mg Intramuscular TID PRN Maryagnes Amos, FNP       levothyroxine (SYNTHROID) tablet 75 mcg  75 mcg Oral Q0600 Maryagnes Amos, FNP   75 mcg at  07/20/23 0614   linagliptin (TRADJENTA) tablet 2.5 mg  2.5 mg Oral BID AC Maryagnes Amos, FNP   2.5 mg at 07/20/23 0737   LORazepam (ATIVAN) tablet 2 mg  2 mg Oral TID PRN Maryagnes Amos, FNP       Or   LORazepam (ATIVAN) injection 2 mg  2 mg Intramuscular TID PRN Starkes-Perry, Juel Burrow, FNP       magnesium hydroxide (MILK OF MAGNESIA) suspension 30 mL  30 mL Oral Daily PRN Starkes-Perry, Juel Burrow, FNP       metFORMIN (GLUCOPHAGE) tablet 500 mg  500 mg Oral BID WC Maryagnes Amos, FNP   500 mg at 07/20/23 0738   QUEtiapine (SEROQUEL) tablet 50 mg  50 mg Oral QHS Maryagnes Amos, FNP   50 mg at 07/19/23 2148   risperiDONE (RISPERDAL) tablet 0.5 mg  0.5 mg Oral BID Maryagnes Amos, FNP   0.5 mg at 07/20/23 1003   PTA Medications: Medications Prior to Admission  Medication Sig Dispense Refill Last Dose   alendronate (FOSAMAX) 70 MG tablet Take 70 mg by mouth once a week.      furosemide (LASIX) 20 MG tablet Take 20 mg by mouth daily.      JARDIANCE 10 MG TABS tablet Take 10 mg by mouth every morning.      JENTADUETO 2.5-500 MG TABS Take 1 tablet by mouth daily.      levothyroxine (SYNTHROID) 75 MCG tablet Take 1 tablet (75 mcg total) by mouth daily at 6 (six) AM. 30  tablet 0    linagliptin (TRADJENTA) 5 MG TABS tablet Take 0.5 tablets (2.5 mg total) by mouth 2 (two) times daily before a meal. 30 tablet 1    metFORMIN (GLUCOPHAGE) 500 MG tablet Take 1 tablet (500 mg total) by mouth 2 (two) times daily with a meal. 60 tablet 0    polyvinyl alcohol (LIQUIFILM TEARS) 1.4 % ophthalmic solution Place 1 drop into both eyes as needed for dry eyes. 15 mL 0    QUEtiapine (SEROQUEL) 50 MG tablet Take 1 tablet (50 mg total) by mouth at bedtime. 30 tablet 0    risperiDONE (RISPERDAL) 0.5 MG tablet Take 1 tablet (0.5 mg total) by mouth 2 (two) times daily. 60 tablet 0     Musculoskeletal: Strength & Muscle Tone: within normal limits Gait & Station: normal Patient leans: N/A            Psychiatric Specialty Exam:  Presentation  General Appearance: No data recorded Eye Contact:No data recorded Speech:No data recorded Speech Volume:No data recorded Handedness:No data recorded  Mood and Affect  Mood:No data recorded Affect:No data recorded  Thought Process  Thought Processes:No data recorded Duration of Psychotic Symptoms:N/A Past Diagnosis of Schizophrenia or Psychoactive disorder: No  Descriptions of Associations:No data recorded Orientation:No data recorded Thought Content:No data recorded Hallucinations:No data recorded Ideas of Reference:No data recorded Suicidal Thoughts:No data recorded Homicidal Thoughts:No data recorded  Sensorium  Memory:No data recorded Judgment:No data recorded Insight:No data recorded  Executive Functions  Concentration:No data recorded Attention Span:No data recorded Recall:No data recorded Fund of Knowledge:No data recorded Language:No data recorded  Psychomotor Activity  Psychomotor Activity:No data recorded  Assets  Assets:No data recorded  Sleep  Sleep:No data recorded   Physical Exam: Physical Exam Constitutional:      Appearance: Normal appearance.  HENT:     Head: Normocephalic and  atraumatic.     Mouth/Throat:     Pharynx: Oropharynx is clear.  Eyes:     Pupils: Pupils are equal, round, and reactive to light.  Cardiovascular:     Rate and Rhythm: Normal rate and regular rhythm.  Pulmonary:     Effort: Pulmonary effort is normal.     Breath sounds: Normal breath sounds.  Abdominal:     General: Abdomen is flat.     Palpations: Abdomen is soft.  Musculoskeletal:        General: Normal range of motion.  Skin:    General: Skin is warm and dry.  Neurological:     General: No focal deficit present.     Mental Status: She is alert. Mental status is at baseline.  Psychiatric:        Attention and Perception: Attention and perception normal.        Mood and Affect: Mood is anxious. Affect is flat.        Speech: Speech normal.        Behavior: Behavior is cooperative.        Thought Content: Thought content is paranoid and delusional.        Cognition and Memory: Memory normal. Cognition is impaired.        Judgment: Judgment is inappropriate.    Review of Systems  Constitutional: Negative.   HENT: Negative.    Eyes: Negative.   Respiratory: Negative.    Cardiovascular: Negative.   Gastrointestinal: Negative.   Genitourinary: Negative.   Musculoskeletal: Negative.   Skin: Negative.   Neurological: Negative.   Endo/Heme/Allergies: Negative.   Psychiatric/Behavioral: Negative.     Blood pressure (!) 111/57, pulse 73, temperature 98.1 F (36.7 C), resp. rate 20, height 5\' 4"  (1.626 m), weight 103.4 kg, SpO2 98%. Body mass index is 39.14 kg/m.  Treatment Plan Summary: Daily contact with patient to assess and evaluate symptoms and progress in treatment, Medication management, and Plan restart Risperdal and Seroquel.  Observation Level/Precautions:  15 minute checks  Laboratory:  CBC Chemistry Profile  Psychotherapy:    Medications:    Consultations:    Discharge Concerns:    Estimated LOS:  Other:     Physician Treatment Plan for Primary  Diagnosis: Acute psychosis (HCC) Long Term Goal(s): Improvement in symptoms so as ready for discharge  Short Term Goals: Ability to identify changes in lifestyle to reduce recurrence of condition will improve, Ability to verbalize feelings will improve, Ability to disclose and discuss suicidal ideas, Ability to demonstrate self-control will improve, Ability to identify and develop effective coping behaviors will improve, Ability to maintain clinical measurements within normal limits will improve, Compliance with prescribed medications will improve, and Ability to identify triggers associated with substance abuse/mental health issues will improve  Physician Treatment Plan for Secondary Diagnosis: Principal Problem:   Acute psychosis (HCC)     I certify that inpatient services furnished can reasonably be expected to improve the patient's condition.    Sarina Ill, DO 9/2/202410:50 AM

## 2023-07-20 NOTE — Group Note (Signed)
Date:  07/20/2023 Time:  11:35 PM  Group Topic/Focus:  Developing a Wellness Toolbox:   The focus of this group is to help patients develop a "wellness toolbox" with skills and strategies to promote recovery upon discharge.    Participation Level:  Active  Participation Quality:  Appropriate  Affect:  Appropriate  Cognitive:  Alert  Insight: Appropriate  Engagement in Group:  Engaged  Modes of Intervention:  Discussion  Additional Comments:    Maeola Harman 07/20/2023, 11:35 PM

## 2023-07-20 NOTE — BHH Suicide Risk Assessment (Signed)
[facility] Admission Suicide Risk Assessment   Nursing information obtained from:    Demographic factors:  Living alone Current Mental Status:  NA Loss Factors:  NA Historical Factors:  NA Risk Reduction Factors:  Positive social support  Total Time spent with patient: 1 hour Principal Problem: Acute psychosis (HCC) Diagnosis:  Principal Problem:   Acute psychosis (HCC)  Subjective Data: Pt here with [employer] under IVC. Pt is off of her medication and hearing voices. Pt has also been continuously calling police stating someone is breaking in to her home but that is not true. Pt locked herself in the bathroom today thinking someone was breaking into her home again. Pt has hx of DM and htn.   Continued Clinical Symptoms:  Alcohol Use Disorder Identification Test Final Score (AUDIT): 0 The "Alcohol Use Disorders Identification Test", Guidelines for Use in Primary Care, Second Edition.  World Science writer [facility]). Score between 0-7:  no or low risk or alcohol related problems. Score between 8-15:  moderate risk of alcohol related problems. Score between 16-19:  high risk of alcohol related problems. Score 20 or above:  warrants further diagnostic evaluation for alcohol dependence and treatment.   CLINICAL FACTORS:   Currently Psychotic Previous Psychiatric Diagnoses and Treatments   Musculoskeletal: Strength & Muscle Tone: within normal limits Gait & Station: normal Patient leans: N/A  Psychiatric Specialty Exam:  Presentation  General Appearance: No data recorded Eye Contact:No data recorded Speech:No data recorded Speech Volume:No data recorded Handedness:No data recorded  Mood and Affect  Mood:No data recorded Affect:No data recorded  Thought Process  Thought Processes:No data recorded Descriptions of Associations:No data recorded Orientation:No data recorded Thought Content:No data recorded History of Schizophrenia/Schizoaffective disorder:No  Duration of Psychotic  Symptoms:Greater than six months  Hallucinations:No data recorded Ideas of Reference:No data recorded Suicidal Thoughts:No data recorded Homicidal Thoughts:No data recorded  Sensorium  Memory:No data recorded Judgment:No data recorded Insight:No data recorded  Executive Functions  Concentration:No data recorded Attention Span:No data recorded Recall:No data recorded Fund of Knowledge:No data recorded Language:No data recorded  Psychomotor Activity  Psychomotor Activity:No data recorded  Assets  Assets:No data recorded  Sleep  Sleep:No data recorded    Blood pressure (!) 111/57, pulse 73, temperature 98.1 F (36.7 C), resp. rate 20, height 5\' 4"  (1.626 m), weight 103.4 kg, SpO2 98%. Body mass index is 39.14 kg/m.   COGNITIVE FEATURES THAT CONTRIBUTE TO RISK:  Loss of executive function    SUICIDE RISK:   Minimal: No identifiable suicidal ideation.  Patients presenting with no risk factors but with morbid ruminations; may be classified as minimal risk based on the severity of the depressive symptoms  PLAN OF CARE: Restart home medications  I certify that inpatient services furnished can reasonably be expected to improve the patient's condition.   Sarina Ill, DO 07/20/2023, 10:48 AM

## 2023-07-21 DIAGNOSIS — F23 Brief psychotic disorder: Secondary | ICD-10-CM | POA: Diagnosis not present

## 2023-07-21 LAB — URINE CULTURE: Culture: >=100000 — AB

## 2023-07-21 MED ORDER — QUETIAPINE FUMARATE 200 MG PO TABS
200.0000 mg | ORAL_TABLET | Freq: Every day | ORAL | Status: DC
  Administered 2023-07-21 – 2023-07-22 (×2): 200 mg via ORAL
  Filled 2023-07-21 (×2): qty 1

## 2023-07-21 NOTE — Progress Notes (Signed)
   07/21/23 2100  Psych Admission Type (Psych Patients Only)  Admission Status Involuntary  Psychosocial Assessment  Patient Complaints None  Eye Contact Fair  Facial Expression Animated  Affect Appropriate to circumstance  Speech Logical/coherent  Interaction Assertive  Motor Activity Slow  Appearance/Hygiene Unremarkable  Behavior Characteristics Cooperative  Mood Pleasant  Thought Process  Coherency WDL  Content WDL  Delusions None reported or observed  Perception WDL  Hallucination None reported or observed  Judgment Impaired  Danger to Self  Current suicidal ideation? Denies  Agreement Not to Harm Self Yes  Description of Agreement verbal  Danger to Others  Danger to Others None reported or observed

## 2023-07-21 NOTE — Group Note (Unsigned)
Date:  07/21/2023 Time:  9:10 AM  Group Topic/Focus:  Making Healthy Choices:   The focus of this group is to help patients identify negative/unhealthy choices they were using prior to admission and identify positive/healthier coping strategies to replace them upon discharge.     Participation Level:  {BHH PARTICIPATION QMVHQ:46962}  Participation Quality:  {BHH PARTICIPATION QUALITY:22265}  Affect:  {BHH AFFECT:22266}  Cognitive:  {BHH COGNITIVE:22267}  Insight: {BHH Insight2:20797}  Engagement in Group:  {BHH ENGAGEMENT IN XBMWU:13244}  Modes of Intervention:  {BHH MODES OF INTERVENTION:22269}  Additional Comments:  ***  Rodena Goldmann 07/21/2023, 9:10 AM

## 2023-07-21 NOTE — Plan of Care (Signed)
  Problem: Activity: Goal: Risk for activity intolerance will decrease Outcome: Progressing   Problem: Nutrition: Goal: Adequate nutrition will be maintained Outcome: Progressing   

## 2023-07-21 NOTE — Group Note (Signed)
LCSW Group Therapy Note  Group Date: 07/21/2023 Start Time: 1330 End Time: 1400   Type of Therapy and Topic:  Group Therapy - Healthy vs Unhealthy Coping Skills  Participation Level:  Did Not Attend   Description of Group The focus of this group was to determine what unhealthy coping techniques typically are used by group members and what healthy coping techniques would be helpful in coping with various problems. Patients were guided in becoming aware of the differences between healthy and unhealthy coping techniques. Patients were asked to identify 2-3 healthy coping skills they would like to learn to use more effectively.  Therapeutic Goals Patients learned that coping is what human beings do all day long to deal with various situations in their lives Patients defined and discussed healthy vs unhealthy coping techniques Patients identified their preferred coping techniques and identified whether these were healthy or unhealthy Patients determined 2-3 healthy coping skills they would like to become more familiar with and use more often. Patients provided support and ideas to each other   Summary of Patient Progress:  X Therapeutic Modalities Cognitive Behavioral Therapy Motivational Interviewing  Elza Rafter, [place] 07/21/2023  2:04 PM

## 2023-07-21 NOTE — Group Note (Signed)
Recreation Therapy Group Note   Group Topic:General Recreation  Group Date: 07/21/2023 Start Time: 1400 End Time: 1450 Facilitators: Rosina Lowenstein, LRT, CTRS Location: Courtyard  Group Description: Outdoor Recreation. Patients had the option to play ring toss, corn hole or sit and listen to music while outside in the courtyard getting fresh air and sunlight. LRT and pts discussed things that they enjoy doing in their free time outside of the hospital.   Goal Area(s) Addressed: Patient will identify leisure interests.  Patient will practice healthy decision making. Patient will engage in recreation activity.   Affect/Mood: N/A   Participation Level: Did not attend    Clinical Observations/Individualized Feedback: Pamela Benson did not attend group.  Plan: Continue to engage patient in RT group sessions 2-3x/week.   Rosina Lowenstein, LRT, CTRS 07/21/2023 3:19 PM

## 2023-07-21 NOTE — Progress Notes (Signed)
Patient was admitted to [facility] for acute psychosis related non med compliance. Patient is calm, cooperative and anxious to be released home. Participates in group activities and is pleasant with staff and peers. C/O of diarrhea today and going over her med list states she is unable to take metformin d/t stomach issues and diarrhea. Doctor was informed and metformin cancelled. Patient denies S/I, H/I and AVH. Will continue to monitor.

## 2023-07-21 NOTE — Group Note (Signed)
Date:  07/21/2023 Time:  9:14 AM  Group Topic/Focus:  Making Healthy Choices:   The focus of this group is to help patients identify negative/unhealthy choices they were using prior to admission and identify positive/healthier coping strategies to replace them upon discharge.    Participation Level:  Active  Participation Quality:  Appropriate  Affect:  Appropriate  Cognitive:  Appropriate  Insight: Appropriate  Engagement in Group:  Engaged  Modes of Intervention:  Discussion  Additional Comments:  none  Rodena Goldmann 07/21/2023, 9:14 AM

## 2023-07-21 NOTE — Group Note (Signed)
Date:  07/21/2023 Time:  10:59 AM  Group Topic/Focus:  Wellness Toolbox:   The focus of this group is to discuss various aspects of wellness, balancing those aspects and exploring ways to increase the ability to experience wellness.  Patients will create a wellness toolbox for use upon discharge.    Participation Level:  Active  Participation Quality:  Appropriate  Affect:  Appropriate  Cognitive:  Appropriate  Insight: Appropriate  Engagement in Group:  Engaged  Modes of Intervention:  Discussion, Education, Socialization, and Support  Additional Comments:    Wilford Corner 07/21/2023, 10:59 AM

## 2023-07-21 NOTE — Progress Notes (Signed)
[facility] MD Progress Note  07/21/2023  Pamela Benson  MRN:  [id number]  Pamela Benson is a 77 year old female who presents to the ER via law enforcement, under IVC due to psychosis  Subjective:   Case discussed with treatment team today, chart reviewed, and patient seen during rounds.  Patient insight into her condition is poor.  She continues to have paranoia about some people coming to her house at night from 10 PM till "sun comes out'. Patient said that she does not know who these people are.  Patient reports that they want to break and enter her house.  Patient also said that these people are saying things to her.  Patient was not able to elaborate on the hallucinations.  Patient said that she has been off her medicines for a month or so.  We discussed med compliance.  Patient is on 2 antipsychotic Risperdal and Seroquel.  Patient wants to continue on Seroquel.  Will discontinue Risperdal and increase the dose of Seroquel.  Principal Problem: Acute psychosis (HCC) Diagnosis: Principal Problem:   Acute psychosis (HCC)   Past Psychiatric History: 1 previous psychiatric admission for a similar presentation 2 months ago and she was supposed to follow-up at [facility].   Past Medical History: History reviewed. No pertinent past medical history. History reviewed. No pertinent surgical history. Family History: History reviewed. No pertinent family history.  Social History:  Social History   Substance and Sexual Activity  Alcohol Use Not Currently     Social History   Substance and Sexual Activity  Drug Use Not on file    Social History   Socioeconomic History   Marital status: Married    Spouse name: Not on file   Number of children: Not on file   Years of education: Not on file   Highest education level: Not on file  Occupational History   Not on file  Tobacco Use   Smoking status: Never   Smokeless tobacco: Never  Substance and Sexual Activity   Alcohol use: Not Currently   Drug  use: Not on file   Sexual activity: Not on file  Other Topics Concern   Not on file  Social History Narrative   Not on file   Social Determinants of Health   Financial Resource Strain: Not on file  Food Insecurity: No Food Insecurity (07/19/2023)   Hunger Vital Sign    Worried About Running Out of Food in the Last Year: Never true    Ran Out of Food in the Last Year: Never true  Transportation Needs: No Transportation Needs (07/19/2023)   PRAPARE - Administrator, Civil Service (Medical): No    Lack of Transportation (Non-Medical): No  Physical Activity: Not on file  Stress: Not on file  Social Connections: Not on file   Additional Social History:                         Sleep: Fair  Appetite:  Fair  Current Medications: Current Facility-Administered Medications  Medication Dose Route Frequency Provider Last Rate Last Admin   acetaminophen (TYLENOL) tablet 650 mg  650 mg Oral Q6H PRN Starkes-Perry, Juel Burrow, FNP       alum & mag hydroxide-simeth (MAALOX/MYLANTA) 200-200-20 MG/5ML suspension 30 mL  30 mL Oral Q4H PRN Starkes-Perry, Juel Burrow, FNP       diphenhydrAMINE (BENADRYL) capsule 50 mg  50 mg Oral TID PRN Rosario Adie Juel Burrow, FNP  Or   diphenhydrAMINE (BENADRYL) injection 50 mg  50 mg Intramuscular TID PRN Starkes-Perry, Juel Burrow, FNP       empagliflozin (JARDIANCE) tablet 10 mg  10 mg Oral q morning Maryagnes Amos, FNP   10 mg at 07/21/23 1014   haloperidol (HALDOL) tablet 5 mg  5 mg Oral TID PRN Maryagnes Amos, FNP       Or   haloperidol lactate (HALDOL) injection 5 mg  5 mg Intramuscular TID PRN Maryagnes Amos, FNP       levothyroxine (SYNTHROID) tablet 75 mcg  75 mcg Oral Q0600 Maryagnes Amos, FNP   75 mcg at 07/21/23 0102   linagliptin (TRADJENTA) tablet 2.5 mg  2.5 mg Oral BID AC Maryagnes Amos, FNP   2.5 mg at 07/21/23 0758   LORazepam (ATIVAN) tablet 2 mg  2 mg Oral TID PRN Maryagnes Amos,  FNP       Or   LORazepam (ATIVAN) injection 2 mg  2 mg Intramuscular TID PRN Starkes-Perry, Juel Burrow, FNP       magnesium hydroxide (MILK OF MAGNESIA) suspension 30 mL  30 mL Oral Daily PRN Starkes-Perry, Juel Burrow, FNP       QUEtiapine (SEROQUEL) tablet 50 mg  50 mg Oral QHS Maryagnes Amos, FNP   50 mg at 07/20/23 2106   risperiDONE (RISPERDAL) tablet 0.5 mg  0.5 mg Oral BID Maryagnes Amos, FNP   0.5 mg at 07/21/23 1015    Lab Results: No results found for this or any previous visit (from the past 48 hour(s)).  Blood Alcohol level:  Lab Results  Component Value Date   ETH <10 07/19/2023    Metabolic Disorder Labs: Lab Results  Component Value Date   HGBA1C 6.8 (H) 05/26/2023   MPG 148 05/26/2023   No results found for: "PROLACTIN" Lab Results  Component Value Date   CHOL 187 05/26/2023   TRIG 140 05/26/2023   HDL 40 (L) 05/26/2023   CHOLHDL 4.7 05/26/2023   VLDL 28 05/26/2023   LDLCALC 119 (H) 05/26/2023      Musculoskeletal: Strength & Muscle Tone: within normal limits Gait & Station: normal Patient leans: N/A  Psychiatric Specialty Exam:  Presentation  General Appearance: Appropriate for Environment  Eye Contact:Fair  Speech:Clear and Coherent  Speech Volume:Normal   Mood and Affect  Mood:Anxious  Affect:Constricted   Thought Process  Thought Processes:Goal Directed; Coherent  Descriptions of Associations:Tangential  Orientation:Full (Time, Place and Person)  Thought Content:Paranoid Ideation; Perseveration; Rumination  History of Schizophrenia/Schizoaffective disorder:Yes  Duration of Psychotic Symptoms:Greater than six months  Hallucinations:Hallucinations: Auditory  Ideas of Reference:Delusions; Paranoia  Suicidal Thoughts:Suicidal Thoughts: No  Homicidal Thoughts:Homicidal Thoughts: No   Sensorium  Memory:Immediate Fair  Judgment:Impaired  Insight:Lacking   Executive Functions  Concentration:Fair  Attention  Span:Fair  Language:Fair   Psychomotor Activity  Psychomotor Activity:Psychomotor Activity: Decreased   Assets  Assets:Communication Skills; Desire for Improvement; Housing   Sleep  Sleep:Sleep: Fair    Physical Exam: Physical Exam Constitutional:      Appearance: Normal appearance.  HENT:     Head: Normocephalic and atraumatic.     Nose: Nose normal.  Eyes:     Pupils: Pupils are equal, round, and reactive to light.  Cardiovascular:     Rate and Rhythm: Normal rate.  Pulmonary:     Effort: Pulmonary effort is normal.  Skin:    General: Skin is warm.  Neurological:     General: No focal  deficit present.     Mental Status: She is alert and oriented to person, place, and time.    Review of Systems  Constitutional:  Negative for chills and fever.  HENT:  Negative for hearing loss and sinus pain.   Eyes:  Negative for blurred vision and double vision.  Respiratory:  Negative for cough and shortness of breath.   Cardiovascular:  Negative for palpitations.  Gastrointestinal:  Negative for nausea and vomiting.  Neurological:  Negative for dizziness and speech change.   Blood pressure 125/62, pulse 80, temperature (!) 97.5 F (36.4 C), resp. rate 20, height 5\' 4"  (1.626 m), weight 103.4 kg, SpO2 98%. Body mass index is 39.14 kg/m.   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

## 2023-07-22 DIAGNOSIS — F23 Brief psychotic disorder: Secondary | ICD-10-CM | POA: Diagnosis not present

## 2023-07-22 NOTE — Progress Notes (Signed)
[facility] MD Progress Note  07/22/2023  Pamela Benson  MRN:  [id number]  Pamela Benson. Pamela Benson is a 77 year old female who presents to the ER via law enforcement, under IVC due to psychosis  Subjective:   Case discussed with treatment team today, chart reviewed, and patient seen during rounds. She continues to have paranoia about some one coming to her house at night from 10 PM till "sun comes out'.  Patient said that she is a female and her name is Pamela Benson.  Patient reports that she has never seen this female before.  Patient reports that she slept good last night.  Her appetite has improved.  Patient denies side effects from increase in the dose of Seroquel.  Patient reports she has been attending groups.  No new acute events reported by patient or staff.  Patient was pleasant and cooperative during the assessment.  Principal Problem: Acute psychosis (HCC) Diagnosis: Principal Problem:   Acute psychosis (HCC)   Past Psychiatric History: 1 previous psychiatric admission for a similar presentation 2 months ago and she was supposed to follow-up at [facility].   Past Medical History: History reviewed. No pertinent past medical history. History reviewed. No pertinent surgical history. Family History: History reviewed. No pertinent family history.  Social History:  Social History   Substance and Sexual Activity  Alcohol Use Not Currently     Social History   Substance and Sexual Activity  Drug Use Not on file    Social History   Socioeconomic History   Marital status: Married    Spouse name: Not on file   Number of children: Not on file   Years of education: Not on file   Highest education level: Not on file  Occupational History   Not on file  Tobacco Use   Smoking status: Never   Smokeless tobacco: Never  Substance and Sexual Activity   Alcohol use: Not Currently   Drug use: Not on file   Sexual activity: Not on file  Other Topics Concern   Not on file  Social History Narrative   Not on  file   Social Determinants of Health   Financial Resource Strain: Not on file  Food Insecurity: No Food Insecurity (07/19/2023)   Hunger Vital Sign    Worried About Running Out of Food in the Last Year: Never true    Ran Out of Food in the Last Year: Never true  Transportation Needs: No Transportation Needs (07/19/2023)   PRAPARE - Administrator, Civil Service (Medical): No    Lack of Transportation (Non-Medical): No  Physical Activity: Not on file  Stress: Not on file  Social Connections: Not on file   Additional Social History:                         Sleep: Fair  Appetite:  Fair  Current Medications: Current Facility-Administered Medications  Medication Dose Route Frequency Provider Last Rate Last Admin   acetaminophen (TYLENOL) tablet 650 mg  650 mg Oral Q6H PRN Starkes-Perry, Juel Burrow, FNP       alum & mag hydroxide-simeth (MAALOX/MYLANTA) 200-200-20 MG/5ML suspension 30 mL  30 mL Oral Q4H PRN Starkes-Perry, Juel Burrow, FNP       diphenhydrAMINE (BENADRYL) capsule 50 mg  50 mg Oral TID PRN Maryagnes Amos, FNP       Or   diphenhydrAMINE (BENADRYL) injection 50 mg  50 mg Intramuscular TID PRN Rosario Adie, Juel Burrow, FNP  empagliflozin (JARDIANCE) tablet 10 mg  10 mg Oral q morning Maryagnes Amos, FNP   10 mg at 07/22/23 1029   haloperidol (HALDOL) tablet 5 mg  5 mg Oral TID PRN Maryagnes Amos, FNP       Or   haloperidol lactate (HALDOL) injection 5 mg  5 mg Intramuscular TID PRN Maryagnes Amos, FNP       levothyroxine (SYNTHROID) tablet 75 mcg  75 mcg Oral Q0600 Maryagnes Amos, FNP   75 mcg at 07/22/23 1029   linagliptin (TRADJENTA) tablet 2.5 mg  2.5 mg Oral BID AC Maryagnes Amos, FNP   2.5 mg at 07/22/23 1656   LORazepam (ATIVAN) tablet 2 mg  2 mg Oral TID PRN Maryagnes Amos, FNP       Or   LORazepam (ATIVAN) injection 2 mg  2 mg Intramuscular TID PRN Starkes-Perry, Juel Burrow, FNP       magnesium  hydroxide (MILK OF MAGNESIA) suspension 30 mL  30 mL Oral Daily PRN Starkes-Perry, Juel Burrow, FNP       QUEtiapine (SEROQUEL) tablet 200 mg  200 mg Oral QHS Lewanda Rife, MD   200 mg at 07/21/23 2145    Lab Results: No results found for this or any previous visit (from the past 48 hour(s)).  Blood Alcohol level:  Lab Results  Component Value Date   ETH <10 07/19/2023    Metabolic Disorder Labs: Lab Results  Component Value Date   HGBA1C 6.8 (H) 05/26/2023   MPG 148 05/26/2023   No results found for: "PROLACTIN" Lab Results  Component Value Date   CHOL 187 05/26/2023   TRIG 140 05/26/2023   HDL 40 (L) 05/26/2023   CHOLHDL 4.7 05/26/2023   VLDL 28 05/26/2023   LDLCALC 119 (H) 05/26/2023      Musculoskeletal: Strength & Muscle Tone: within normal limits Gait & Station: normal Patient leans: N/A  Psychiatric Specialty Exam:  Presentation  General Appearance: Appropriate for Environment  Eye Contact:Fair  Speech:Clear and Coherent  Speech Volume:Normal   Mood and Affect  Mood:"Good"  Affect:Stable, more animated   Thought Process  Thought Processes:Goal Directed; Coherent  Descriptions of Associations:Tangential  Orientation:Full (Time, Place and Person)  Thought Content:Paranoid Ideation; Perseveration; Rumination  History of Schizophrenia/Schizoaffective disorder:Yes  Duration of Psychotic Symptoms:Greater than six months  Hallucinations:Denies today  Ideas of Reference:Delusions; Paranoia  Suicidal Thoughts:Suicidal Thoughts: No  Homicidal Thoughts:Homicidal Thoughts: No   Sensorium  Memory:Immediate Fair  Judgment:Impaired  Insight:Lacking   Executive Functions  Concentration:Fair  Attention Span:Fair  Language:Fair   Psychomotor Activity  Psychomotor Activity:Normal   Assets  Assets:Communication Skills; Desire for Improvement; Housing   Sleep  Sleep:Sleep: Fair    Physical Exam: Physical Exam Constitutional:       Appearance: Normal appearance.  HENT:     Head: Normocephalic and atraumatic.     Nose: Nose normal.  Eyes:     Pupils: Pupils are equal, round, and reactive to light.  Cardiovascular:     Rate and Rhythm: Normal rate.  Pulmonary:     Effort: Pulmonary effort is normal.  Skin:    General: Skin is warm.  Neurological:     General: No focal deficit present.     Mental Status: She is alert and oriented to person, place, and time.    Review of Systems  Constitutional:  Negative for chills and fever.  HENT:  Negative for hearing loss and sinus pain.   Eyes:  Negative for  blurred vision and double vision.  Respiratory:  Negative for cough and shortness of breath.   Cardiovascular:  Negative for palpitations.  Gastrointestinal:  Negative for nausea and vomiting.  Neurological:  Negative for dizziness and speech change.   Blood pressure 122/61, pulse 78, temperature (!) 97.3 F (36.3 C), resp. rate 16, height 5\' 4"  (1.626 m), weight 103.4 kg, SpO2 96%. Body mass index is 39.14 kg/m.   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

## 2023-07-22 NOTE — Group Note (Signed)
Recreation Therapy Group Note   Group Topic:Healthy Support Systems  Group Date: 07/22/2023 Start Time: 1400 End Time: 1455 Facilitators: Rosina Lowenstein, LRT, CTRS Location: Courtyard  Group Description: Emotional Check in. Patient sat and talked with LRT about how they are doing and whatever else is on their mind. LRT provided active listening, reassurance and encouragement. Pts were given the opportunity to listen to music or play cornhole while getting fresh air and sunlight in the courtyard.    Goal Area(s) Addressed: Patient will engage in conversation with LRT. Patient will communicate their wants, needs, or questions.  Patient will practice a new coping skill of "talking to someone".   Affect/Mood: Appropriate   Participation Level: Active and Engaged   Participation Quality: Independent   Behavior: Appropriate, Calm, and Cooperative   Speech/Thought Process: Coherent   Insight: Good   Judgement: Good   Modes of Intervention: Activity   Patient Response to Interventions:  Attentive, Engaged, Interested , and Receptive   Education Outcome:  Acknowledges education   Clinical Observations/Individualized Feedback: Kajal was active in their participation of session activities and group discussion. Pt identified "I really like this type of music" referring to 70's hits. Pt was pleasant and talkative with peers and LRT while outside. Pt had a bright affect when interacting.    Plan: Continue to engage patient in RT group sessions 2-3x/week.   Rosina Lowenstein, LRT, CTRS 07/22/2023 3:30 PM

## 2023-07-22 NOTE — Progress Notes (Signed)
   07/22/23 2329  Psych Admission Type (Psych Patients Only)  Admission Status Involuntary  Psychosocial Assessment  Patient Complaints None  Eye Contact Fair  Facial Expression Animated  Affect Appropriate to circumstance  Speech Logical/coherent  Interaction Assertive  Motor Activity Slow  Appearance/Hygiene Unremarkable  Behavior Characteristics Cooperative  Mood Pleasant  Thought Process  Coherency WDL  Content WDL  Delusions None reported or observed  Perception WDL  Hallucination None reported or observed  Judgment Impaired  Confusion WDL  Danger to Self  Current suicidal ideation? Denies  Agreement Not to Harm Self Yes  Description of Agreement verbal  Danger to Others  Danger to Others None reported or observed  Danger to Others Abnormal  Harmful Behavior to others No threats or harm toward other people  Destructive Behavior No threats or harm toward property

## 2023-07-22 NOTE — Group Note (Signed)
Date:  07/22/2023 Time:  2:17 PM  Group Topic/Focus:  Building Self Esteem:   The Focus of this group is helping patients become aware of the effects of self-esteem on their lives, the things they and others do that enhance or undermine their self-esteem, seeing the relationship between their level of self-esteem and the choices they make and learning ways to enhance self-esteem. Self Care:   The focus of this group is to help patients understand the importance of self-care in order to improve or restore emotional, physical, spiritual, interpersonal, and financial health.    Participation Level:  Active  Participation Quality:  Appropriate, Attentive, Sharing, and Supportive  Affect:  Appropriate  Cognitive:  Alert, Appropriate, and Oriented  Insight: Appropriate, Good, and Improving  Engagement in Group:  Engaged and Supportive  Modes of Intervention:  Activity  Additional Comments:     [name] 07/22/2023, 2:17 PM

## 2023-07-22 NOTE — Plan of Care (Signed)
  Problem: Activity: Goal: Risk for activity intolerance will decrease Outcome: Progressing   Problem: Nutrition: Goal: Adequate nutrition will be maintained Outcome: Progressing   Problem: Pain Managment: Goal: General experience of comfort will improve Outcome: Progressing   Problem: Safety: Goal: Ability to remain free from injury will improve Outcome: Progressing   Problem: Skin Integrity: Goal: Risk for impaired skin integrity will decrease Outcome: Progressing   

## 2023-07-22 NOTE — Group Note (Signed)
Date:  07/22/2023 Time:  2:36 AM  Group Topic/Focus:  Goals Group:   The focus of this group is to help patients establish daily goals to achieve during treatment and discuss how the patient can incorporate goal setting into their daily lives to aide in recovery.    Participation Level:  Active  Participation Quality:  Appropriate  Affect:  Appropriate  Cognitive:  Appropriate  Insight: Appropriate  Engagement in Group:  Engaged  Modes of Intervention:  Discussion  Additional Comments:    [name] 07/22/2023, 2:36 AM

## 2023-07-22 NOTE — Progress Notes (Signed)
Patient admitted on July 19, 2023 for complaints of being off her medications, difficulty sleeping, and reports of bizarre and paranoid behavior. Diagnosis of acute psychosis.  Patient presents with an appropriate affect and pleasant mood. She denies SI/HI/AVH. She denies anxiety and depression. Patient participated in group and rec therapy. She communicates well with staff and other patients. No new orders.  Discharge potentially on Friday, July 24, 2023. Q15 minute unit checks in place.

## 2023-07-23 DIAGNOSIS — F23 Brief psychotic disorder: Secondary | ICD-10-CM | POA: Diagnosis not present

## 2023-07-23 MED ORDER — QUETIAPINE FUMARATE 200 MG PO TABS
300.0000 mg | ORAL_TABLET | Freq: Every day | ORAL | Status: DC
  Administered 2023-07-23: 300 mg via ORAL
  Filled 2023-07-23: qty 1

## 2023-07-23 MED ORDER — EYE WASH OP SOLN
1.0000 [drp] | OPHTHALMIC | Status: DC | PRN
  Filled 2023-07-23: qty 118

## 2023-07-23 NOTE — Plan of Care (Signed)
D: Pt alert and oriented. Pt denies experiencing any anxiety/depression at this time. Pt denies experiencing any pain at this time. Pt denies experiencing any SI/HI, or AVH at this time.   A: Scheduled medications administered to pt, per MD orders. Support and encouragement provided. Frequent verbal contact made. Routine safety checks conducted q15 minutes.   R: No adverse drug reactions noted. Pt verbally contracts for safety at this time. Pt compliant with medications and treatment plan. Pt interacts well with others on the unit. Pt remains safe at this time. Plan of care ongoing.  Problem: Education: Goal: Knowledge of General Education information will improve Description: Including pain rating scale, medication(s)/side effects and non-pharmacologic comfort measures Outcome: Progressing   Problem: Coping: Goal: Level of anxiety will decrease Outcome: Progressing

## 2023-07-23 NOTE — Plan of Care (Signed)

## 2023-07-23 NOTE — Group Note (Signed)
Recreation Therapy Group Note   Group Topic:Emotion Expression  Group Date: 07/23/2023 Start Time: 1400 End Time: 1440 Facilitators: Rosina Lowenstein, LRT, CTRS Location: Courtyard  Group Description: Music Reminisce. LRT encouraged patients to think of their favorite song(s) that reminded them of a positive memory or time in their life. LRT encouraged patient to talk about that memory aloud to the group. LRT played the song through a speaker for all to hear. LRT and patients discussed how thinking of a positive memory or time in their life can be used as a coping skill in everyday life post discharge.    Goal Area(s) Addressed: Patient will increase verbal communication by conversing with peers. Patient will contribute to group discussion with minimal prompting. Patient will reminisce a positive memory or moment in their life.    Affect/Mood: Appropriate   Participation Level: Active and Engaged   Participation Quality: Independent   Behavior: Appropriate, Calm, and Cooperative   Speech/Thought Process: Coherent   Insight: Good   Judgement: Good   Modes of Intervention: Guided Discussion and Music   Patient Response to Interventions:  Attentive, Engaged, Interested , and Receptive   Education Outcome:  Acknowledges education   Clinical Observations/Individualized Feedback: Pamela Benson was active in their participation of session activities and group discussion. Pt identified "Bennett Scrape and Elvis songs are my favorite. I went and saw Elvis in concert and it was the best performance." Pt interacted well with LRT and peers duration of session.   Plan: Continue to engage patient in RT group sessions 2-3x/week.   Rosina Lowenstein, LRT, CTRS 07/23/2023 3:24 PM

## 2023-07-23 NOTE — Group Note (Signed)
[facility] LCSW Group Therapy Note   Group Date: 07/23/2023 Start Time: 1315 End Time: 1400  Type of Therapy/Topic:  Group Therapy:  Feelings about Diagnosis  Participation Level:  Active   Mood: Calm    Description of Group:    This group will allow patients to explore their thoughts and feelings about diagnoses they have received. Patients will be guided to explore their level of understanding and acceptance of these diagnoses. Facilitator will encourage patients to process their thoughts and feelings about the reactions of others to their diagnosis, and will guide patients in identifying ways to discuss their diagnosis with significant others in their lives. This group will be process-oriented, with patients participating in exploration of their own experiences as well as giving and receiving support and challenge from other group members.   Therapeutic Goals: 1. Patient will demonstrate understanding of diagnosis as evidence by identifying two or more symptoms of the disorder:  2. Patient will be able to express two feelings regarding the diagnosis 3. Patient will demonstrate ability to communicate their needs through discussion and/or role plays  Summary of Patient Progress:    Pt was able to discuss feelings around diagnosis appropriately and was able to accept feedback from peers and facilitator.      Therapeutic Modalities:   Cognitive Behavioral Therapy Brief Therapy Feelings Identification    Pamela Benson, LCSWA

## 2023-07-23 NOTE — Progress Notes (Signed)
   07/23/23 1930  Psych Admission Type (Psych Patients Only)  Admission Status Involuntary  Psychosocial Assessment  Patient Complaints None  Eye Contact Fair  Facial Expression Animated  Affect Appropriate to circumstance  Speech Logical/coherent  Interaction Assertive  Motor Activity Slow  Appearance/Hygiene Unremarkable  Behavior Characteristics Appropriate to situation  Mood Pleasant  Thought Process  Coherency WDL  Content WDL  Delusions None reported or observed  Perception WDL  Hallucination None reported or observed  Judgment Impaired  Danger to Self  Current suicidal ideation? Denies  Agreement Not to Harm Self Yes  Description of Agreement verbal  Danger to Others  Danger to Others None reported or observed

## 2023-07-23 NOTE — Progress Notes (Signed)
Pamela Benson Progress Note  07/23/2023  MARSHE BABIAK  MRN:  [id number]  Pamela Benson is a 77 year old female who presents to the ER via law enforcement, under IVC due to psychosis  Subjective:   Case discussed with treatment team today, chart reviewed, and patient seen during rounds. Patient reports she feels better.   Reports her sleep and appetite has improved.  Patient reports she has been attending groups.  No new acute events reported by patient or staff.  Patient was pleasant and cooperative during the assessment. Patient continues to believe Verdon Cummins comes to her house at night from 10 PM till sun comes out.  It is probably a fixed delusion.She denies AVH or SI/HI  Principal Problem: Acute psychosis (HCC) Diagnosis: Principal Problem:   Acute psychosis (HCC)   Past Psychiatric History: 1 previous psychiatric admission for a similar presentation 2 months ago and she was supposed to follow-up at [facility].   Past Medical History: History reviewed. No pertinent past medical history. History reviewed. No pertinent surgical history. Family History: History reviewed. No pertinent family history.  Social History:  Social History   Substance and Sexual Activity  Alcohol Use Not Currently     Social History   Substance and Sexual Activity  Drug Use Not on file    Social History   Socioeconomic History   Marital status: Married    Spouse name: Not on file   Number of children: Not on file   Years of education: Not on file   Highest education level: Not on file  Occupational History   Not on file  Tobacco Use   Smoking status: Never   Smokeless tobacco: Never  Substance and Sexual Activity   Alcohol use: Not Currently   Drug use: Not on file   Sexual activity: Not on file  Other Topics Concern   Not on file  Social History Narrative   Not on file   Social Determinants of Health   Financial Resource Strain: Not on file  Food Insecurity: No Food Insecurity (07/19/2023)   Hunger  Vital Sign    Worried About Running Out of Food in the Last Year: Never true    Ran Out of Food in the Last Year: Never true  Transportation Needs: No Transportation Needs (07/19/2023)   PRAPARE - Administrator, Civil Service (Medical): No    Lack of Transportation (Non-Medical): No  Physical Activity: Not on file  Stress: Not on file  Social Connections: Not on file   Additional Social History:                         Sleep: Fair  Appetite:  Fair  Current Medications: Current Facility-Administered Medications  Medication Dose Route Frequency Provider Last Rate Last Admin   acetaminophen (TYLENOL) tablet 650 mg  650 mg Oral Q6H PRN Starkes-Perry, Juel Burrow, FNP       alum & mag hydroxide-simeth (MAALOX/MYLANTA) 200-200-20 MG/5ML suspension 30 mL  30 mL Oral Q4H PRN Starkes-Perry, Juel Burrow, FNP       diphenhydrAMINE (BENADRYL) capsule 50 mg  50 mg Oral TID PRN Maryagnes Amos, FNP       Or   diphenhydrAMINE (BENADRYL) injection 50 mg  50 mg Intramuscular TID PRN Starkes-Perry, Juel Burrow, FNP       empagliflozin (JARDIANCE) tablet 10 mg  10 mg Oral q morning Maryagnes Amos, FNP   10 mg at 07/23/23 [phone]  haloperidol (HALDOL) tablet 5 mg  5 mg Oral TID PRN Maryagnes Amos, FNP       Or   haloperidol lactate (HALDOL) injection 5 mg  5 mg Intramuscular TID PRN Maryagnes Amos, FNP       levothyroxine (SYNTHROID) tablet 75 mcg  75 mcg Oral Q0600 Maryagnes Amos, FNP   75 mcg at 07/23/23 0630   linagliptin (TRADJENTA) tablet 2.5 mg  2.5 mg Oral BID AC Maryagnes Amos, FNP   2.5 mg at 07/23/23 1642   LORazepam (ATIVAN) tablet 2 mg  2 mg Oral TID PRN Maryagnes Amos, FNP       Or   LORazepam (ATIVAN) injection 2 mg  2 mg Intramuscular TID PRN Starkes-Perry, Juel Burrow, FNP       magnesium hydroxide (MILK OF MAGNESIA) suspension 30 mL  30 mL Oral Daily PRN Starkes-Perry, Juel Burrow, FNP       QUEtiapine (SEROQUEL) tablet 300 mg  300  mg Oral QHS Lewanda Rife, Benson        Lab Results: No results found for this or any previous visit (from the past 48 hour(s)).  Blood Alcohol level:  Lab Results  Component Value Date   ETH <10 07/19/2023    Metabolic Disorder Labs: Lab Results  Component Value Date   HGBA1C 6.8 (H) 05/26/2023   MPG 148 05/26/2023   No results found for: "PROLACTIN" Lab Results  Component Value Date   CHOL 187 05/26/2023   TRIG 140 05/26/2023   HDL 40 (L) 05/26/2023   CHOLHDL 4.7 05/26/2023   VLDL 28 05/26/2023   LDLCALC 119 (H) 05/26/2023      Musculoskeletal: Strength & Muscle Tone: within normal limits Gait & Station: normal Patient leans: N/A  Psychiatric Specialty Exam:  Presentation  General Appearance: Appropriate for Environment  Eye Contact:Fair  Speech:Clear and Coherent  Speech Volume:Normal   Mood and Affect  Mood:"Good"  Affect:Stable, more animated   Thought Process  Thought Processes:Goal Directed; Coherent  Descriptions of Associations:Improved, Intact  Orientation:Full (Time, Place and Person)  Thought Content:Paranoid Ideation;   History of Schizophrenia/Schizoaffective disorder:Yes  Duration of Psychotic Symptoms:Greater than six months  Hallucinations:Denies today  Ideas of Reference:Delusions; Paranoia, probably at baseline  Suicidal Thoughts:Denies  Homicidal Thoughts:Denies   Sensorium  Memory:Immediate Fair  Judgment:Impaired  Insight:Lacking   Executive Functions  Concentration:Fair  Attention Span:Fair  Language:Fair   Psychomotor Activity  Psychomotor Activity:Normal   Assets  Assets:Communication Skills; Desire for Improvement; Housing   Sleep  Sleep:Good    Physical Exam: Physical Exam Constitutional:      Appearance: Normal appearance.  HENT:     Head: Normocephalic and atraumatic.     Nose: Nose normal.  Eyes:     Pupils: Pupils are equal, round, and reactive to light.  Cardiovascular:      Rate and Rhythm: Normal rate.  Pulmonary:     Effort: Pulmonary effort is normal.  Skin:    General: Skin is warm.  Neurological:     General: No focal deficit present.     Mental Status: She is alert and oriented to person, place, and time.    Review of Systems  Constitutional:  Negative for chills and fever.  HENT:  Negative for hearing loss and sinus pain.   Eyes:  Negative for blurred vision and double vision.  Respiratory:  Negative for cough and shortness of breath.   Cardiovascular:  Negative for palpitations.  Gastrointestinal:  Negative for nausea and  vomiting.  Neurological:  Negative for dizziness and speech change.   Blood pressure (!) 119/53, pulse 72, temperature 97.9 F (36.6 C), resp. rate 14, height 5\' 4"  (1.626 m), weight 103.4 kg, SpO2 96%. Body mass index is 39.14 kg/m.   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

## 2023-07-23 NOTE — Plan of Care (Signed)
  Problem: Education: Goal: Knowledge of General Education information will improve Description Including pain rating scale, medication(s)/side effects and non-pharmacologic comfort measures Outcome: Adequate for Discharge   Problem: Health Behavior/Discharge Planning: Goal: Ability to manage health-related needs will improve Outcome: Adequate for Discharge   

## 2023-07-23 NOTE — Group Note (Signed)
Date:  07/23/2023 Time:  6:32 PM  Group Topic/Focus:  Activity Group:  The focus of the group is to encourage patients to go outside and get some fresh air and some exercise as well.    Participation Level:  Active  Participation Quality:  Appropriate  Affect:  Appropriate  Cognitive:  Appropriate  Insight: Appropriate  Engagement in Group:  Engaged  Modes of Intervention:  Activity  Additional Comments:    Pamela Benson 07/23/2023, 6:32 PM

## 2023-07-24 DIAGNOSIS — F23 Brief psychotic disorder: Secondary | ICD-10-CM | POA: Diagnosis not present

## 2023-07-24 MED ORDER — EYE WASH OP SOLN: 1.0000 [drp] | OPHTHALMIC | 0 refills | Status: DC | PRN

## 2023-07-24 MED ORDER — LINAGLIPTIN 5 MG PO TABS: 2.5000 mg | ORAL_TABLET | Freq: Two times a day (BID) | ORAL | 0 refills | Status: DC

## 2023-07-24 MED ORDER — QUETIAPINE FUMARATE 300 MG PO TABS: 300.0000 mg | ORAL_TABLET | Freq: Every day | ORAL | 1 refills | Status: DC

## 2023-07-24 MED ORDER — EMPAGLIFLOZIN 10 MG PO TABS: 10.0000 mg | ORAL_TABLET | Freq: Every morning | ORAL | 0 refills | Status: AC

## 2023-07-24 MED ORDER — LEVOTHYROXINE SODIUM 75 MCG PO TABS: 75.0000 ug | ORAL_TABLET | Freq: Every day | ORAL | 0 refills | Status: DC

## 2023-07-24 NOTE — [facility] Suicide Risk Assessment (Signed)
[facility] Discharge Suicide Risk Assessment   Principal Problem: Acute psychosis (HCC) Discharge Diagnoses: Principal Problem:   Acute psychosis (HCC)    Musculoskeletal: Strength & Muscle Tone: within normal limits Gait & Station: normal Patient leans: N/A   Psychiatric Specialty Exam:   Presentation  General Appearance: Appropriate for Environment   Eye Contact:Fair   Speech:Clear and Coherent   Speech Volume:Normal     Mood and Affect  Mood:"Good"   Affect:Stable, more animated     Thought Process  Thought Processes:Goal Directed; Coherent   Descriptions of Associations:Improved, Intact   Orientation:Full (Time, Place and Person)   Thought Content:Paranoid Ideation;    History of Schizophrenia/Schizoaffective disorder:Yes   Duration of Psychotic Symptoms:Greater than six months   Hallucinations:Denies today   Ideas of Reference:Delusions; Paranoia, probably at baseline   Suicidal Thoughts:Denies   Homicidal Thoughts:Denies     Sensorium  Memory:Immediate Fair   Judgment:Impaired   Insight:Lacking     Executive Functions  Concentration:Fair   Attention Span:Fair   Language:Fair     Psychomotor Activity  Psychomotor Activity:Normal     Assets  Assets:Communication Skills; Desire for Improvement; Housing     Sleep  Sleep:Good       Physical Exam: Physical Exam Constitutional:      Appearance: Normal appearance.  HENT:     Head: Normocephalic and atraumatic.     Nose: Nose normal.  Eyes:     Pupils: Pupils are equal, round, and reactive to light.  Cardiovascular:     Rate and Rhythm: Normal rate.  Pulmonary:     Effort: Pulmonary effort is normal.  Skin:    General: Skin is warm.  Neurological:     General: No focal deficit present.     Mental Status: She is alert and oriented to person, place, and time.      Review of Systems  Constitutional:  Negative for chills and fever.  HENT:  Negative for hearing loss and sinus  pain.   Eyes:  Negative for blurred vision and double vision.  Respiratory:  Negative for cough and shortness of breath.   Cardiovascular:  Negative for palpitations.  Gastrointestinal:  Negative for nausea and vomiting.  Neurological:  Negative for dizziness and speech change.   Blood pressure 132/64, pulse 66, temperature (!) 97.3 F (36.3 C), resp. rate 16, height 5\' 4"  (1.626 m), weight 103.4 kg, SpO2 98%. Body mass index is 39.14 kg/m.   Demographic Factors:  Female, Age 77 or older, Caucasian, and Living alone  Loss Factors: NA  Historical Factors: Impulsivity  Risk Reduction Factors:   Positive therapeutic relationship and Positive coping skills or problem solving skills  Continued Clinical Symptoms:  Previous Psychiatric Diagnoses and Treatments  Cognitive Features That Contribute To Risk:  Thought constriction (tunnel vision)    Suicide Risk:  Minimal: No identifiable suicidal ideation.    Plan Of Care/Follow-up recommendations:  Per H&P  Lewanda Rife, MD

## 2023-07-24 NOTE — Plan of Care (Signed)
D: Pt alert and oriented. Pt denies experiencing any anxiety/depression at this time. Pt denies experiencing any pain at this time. Pt denies experiencing any SI/HI, or AVH at this time.   A: Scheduled medications administered to pt, per MD orders. Support and encouragement provided. Frequent verbal contact made. Routine safety checks conducted q15 minutes.   R: No adverse drug reactions noted. Pt verbally contracts for safety at this time. Pt compliant with medications and treatment plan. Pt interacts well with others on the unit. Pt remains safe at this time. Plan of care ongoing.  Problem: Education: Goal: Knowledge of General Education information will improve Description: Including pain rating scale, medication(s)/side effects and non-pharmacologic comfort measures Outcome: Progressing   Problem: Coping: Goal: Level of anxiety will decrease Outcome: Progressing

## 2023-07-24 NOTE — [facility] Counselor (Signed)
Pt was given blank IM due to medical records not scanning in signed IM in time of discharge   Pamela Benson, MSW, [facility] 07/24/2023 10:24 AM

## 2023-07-24 NOTE — [facility] Suicide Risk Assessment (Signed)
[facility] INPATIENT:  Family/Significant Other Suicide Prevention Education  Suicide Prevention Education:  Patient Refusal for Family/Significant Other Suicide Prevention Education: The patient Pamela Benson has refused to provide written consent for family/significant other to be provided Family/Significant Other Suicide Prevention Education during admission and/or prior to discharge.  Physician notified.  Elza Rafter 07/24/2023, 10:23 AM

## 2023-07-24 NOTE — Progress Notes (Signed)
D: Pt alert and oriented. Pt denies experiencing any pain, SI/HI, or AVH at this time. Pt reports she will be able to keep herself safe when she returns home. Pt has completed a suicide safety plan and was given a survey to fill out.  A: Pt received discharge and medication education/information. Pt belongings were returned and signed for at this time. Transition, AVS, and SRA reviewed and given to patient. Pt denies wanting floor manager to contact them after discharge.  R: Pt verbalized understanding of discharge and medication education/information.  Pt escorted by staff to the medical mall front lobby where pt was picked up by her friend and transported home.

## 2023-07-24 NOTE — Group Note (Signed)
Date:  07/24/2023 Time:  1:47 PM  Group Topic/Focus:  Making Healthy Choices:   The focus of this group is to help patients identify negative/unhealthy choices they were using prior to admission and identify positive/healthier coping strategies to replace them upon discharge.    Participation Level:  Active  Participation Quality:  Attentive  Affect:  Appropriate  Cognitive:  Appropriate  Insight: Appropriate  Engagement in Group:  Engaged  Modes of Intervention:  Activity  Additional Comments:    Henderson Frampton 07/24/2023, 1:47 PM

## 2023-07-24 NOTE — Discharge Summary (Signed)
Physician Discharge Summary Note  Patient:  Pamela Benson is an 77 y.o., female MRN:  [id number] DOB:  1946/06/27 Patient phone:  [phone] (home)  Patient address:   [address] [place] [address],    Date of Admission:  07/19/2023 Date of Discharge: 07/24/2023  Reason for Admission:   Arlenis is a 77 year old white female who presents to [facility] on an involuntary basis for a similar presentation for which she was admitted proximately 2 months ago.  She ran out of her medications and was not sleeping and became very paranoid and locked herself into the bathroom thinking that people   Principal Problem: Acute psychosis [facility]) Discharge Diagnoses: Principal Problem:   Acute psychosis (HCC)   Past Psychiatric History: 1 previous psychiatric admission for a similar presentation 2 months ago and she was supposed to follow-up at [facility].   Past Medical History: History reviewed. No pertinent past medical history. History reviewed. No pertinent surgical history. Family History: History reviewed. No pertinent family history.  Social History:  Social History   Substance and Sexual Activity  Alcohol Use Not Currently     Social History   Substance and Sexual Activity  Drug Use Not on file    Social History   Socioeconomic History   Marital status: Married    Spouse name: Not on file   Number of children: Not on file   Years of education: Not on file   Highest education level: Not on file  Occupational History   Not on file  Tobacco Use   Smoking status: Never   Smokeless tobacco: Never  Substance and Sexual Activity   Alcohol use: Not Currently   Drug use: Not on file   Sexual activity: Not on file  Other Topics Concern   Not on file  Social History Narrative   Not on file   Social Determinants of Health   Financial Resource Strain: Not on file  Food Insecurity: No Food Insecurity (07/19/2023)   Hunger Vital Sign    Worried About Running Out of Food in the Last  Year: Never true    Ran Out of Food in the Last Year: Never true  Transportation Needs: No Transportation Needs (07/19/2023)   PRAPARE - Administrator, Civil Service (Medical): No    Lack of Transportation (Non-Medical): No  Physical Activity: Not on file  Stress: Not on file  Social Connections: Not on file    Hospital Course:  The patient was admitted to Inpatient psychiatric treatment for stabilization of psychosis and paranoid behaviors. Patient was placed on safety precautions. The patient was evaluated and treated by the multidisciplinary treatment team including physicians, nurses, social workers and therapists. All medications were presented to the patient and the Patient gave consent to all the medications that they were given, as well as was explained the risks, benefits, side effects and alternatives of all medication therapies. The patient was integrated into the general milieu on the ward and encouraged to attend to her ADLs and participate in all groups and activities. During hospital course the Patient attended coping skill groups, music therapy and activity therapy groups. Patient was counseled on cognitive techniques/skills by multiple staff members and given support care by the staff.  Patient's medication regimen was evaluated and titrated to therapeutic levels to better Patient's overall daily functioning. Specifically, the patient was started on Seroquel to target psychosis.  She tolerated the medication well with no significant side effects.  During the hospitalization, the patient  demonstrated a stabilization of mood with decreased racing thoughts, decreased impulsivity, improved sleep and decreased irritability. At the time of discharge, the patient denied any suicidal ideation/homicidal ideation and was not overtly depressed, manic or psychotic except for a fixed delusion about an "Verdon Cummins" female trying to break and enter her house.  On the day of discharge patient  said" I hope when I go home she is not there and she fades away" the Patient was interacting well in groups and on the unit with their peers. Patient was able to identify a safety plan to include speaking with family, contacting outpatient provider or calling 911 if hallucinations/delusions returned or worsened or thoughts of self-harm or suicide return. Patient was counselled on outpatient follow-up that was arranged prior to discharge.   Musculoskeletal: Strength & Muscle Tone: within normal limits Gait & Station: normal Patient leans: N/A   Psychiatric Specialty Exam:   Presentation  General Appearance: Appropriate for Environment   Eye Contact:Fair   Speech:Clear and Coherent   Speech Volume:Normal     Mood and Affect  Mood:"Good"   Affect:Stable, more animated     Thought Process  Thought Processes:Goal Directed; Coherent   Descriptions of Associations:Improved, Intact   Orientation:Full (Time, Place and Person)   Thought Content:Paranoid Ideation; probably a fixed delusion   History of Schizophrenia/Schizoaffective disorder:Yes   Duration of Psychotic Symptoms:Greater than six months   Hallucinations:Denies today   Ideas of Reference:Delusions; Paranoia, probably at baseline   Suicidal Thoughts:Denies, no intentions or plans   Homicidal Thoughts:Denies, no intentions or plans     Sensorium  Memory:Immediate Fair   Judgment:Improving   Insight: Limited due to a fixed delusion     Executive Functions  Concentration:Fair   Attention Span:Fair   Language:Fair     Psychomotor Activity  Psychomotor Activity:Normal     Assets  Assets:Communication Skills; Desire for Improvement; Housing     Sleep  Sleep:Good       Physical Exam: Physical Exam Constitutional:      Appearance: Normal appearance.  HENT:     Head: Normocephalic and atraumatic.     Nose: Nose normal.  Eyes:     Pupils: Pupils are equal, round, and reactive to light.   Cardiovascular:     Rate and Rhythm: Normal rate.  Pulmonary:     Effort: Pulmonary effort is normal.  Skin:    General: Skin is warm.  Neurological:     General: No focal deficit present.     Mental Status: She is alert and oriented to person, place, and time.      Review of Systems  Constitutional:  Negative for chills and fever.  HENT:  Negative for hearing loss and sinus pain.   Eyes:  Negative for blurred vision and double vision.  Respiratory:  Negative for cough and shortness of breath.   Cardiovascular:  Negative for palpitations.  Gastrointestinal:  Negative for nausea and vomiting.  Neurological:  Negative for dizziness and speech change.   Blood pressure 132/64, pulse 66, temperature (!) 97.3 F (36.3 C), resp. rate 16, height 5\' 4"  (1.626 m), weight 103.4 kg, SpO2 98%. Body mass index is 39.14 kg/m.   Social History   Tobacco Use  Smoking Status Never  Smokeless Tobacco Never   Tobacco Cessation:  N/A, patient does not currently use tobacco products   Blood Alcohol level:  Lab Results  Component Value Date   ETH <10 07/19/2023    Metabolic Disorder Labs:  Lab Results  Component  Value Date   HGBA1C 6.8 (H) 05/26/2023   MPG 148 05/26/2023   No results found for: "PROLACTIN" Lab Results  Component Value Date   CHOL 187 05/26/2023   TRIG 140 05/26/2023   HDL 40 (L) 05/26/2023   CHOLHDL 4.7 05/26/2023   VLDL 28 05/26/2023   LDLCALC 119 (H) 05/26/2023    See Psychiatric Specialty Exam and Suicide Risk Assessment completed by Attending Physician prior to discharge.  Discharge destination:  Home  Is patient on multiple antipsychotic therapies at discharge:  No    Recommended Plan for Multiple Antipsychotic Therapies: NA   Allergies as of 07/24/2023   No Known Allergies      Medication List     STOP taking these medications    furosemide 20 MG tablet Commonly known as: LASIX   Jentadueto 2.5-500 MG Tabs Generic drug:  linaGLIPtin-metFORMIN HCl   metFORMIN 500 MG tablet Commonly known as: GLUCOPHAGE   risperiDONE 0.5 MG tablet Commonly known as: RISPERDAL       TAKE these medications      Indication  alendronate 70 MG tablet Commonly known as: FOSAMAX Take 70 mg by mouth once a week.    empagliflozin 10 MG Tabs tablet Commonly known as: Jardiance Take 1 tablet (10 mg total) by mouth every morning. Start taking on: July 25, 2023    eye wash Soln Place 1 drop into both eyes as needed for dry eyes.    levothyroxine 75 MCG tablet Commonly known as: SYNTHROID Take 1 tablet (75 mcg total) by mouth daily at 6 (six) AM.    linagliptin 5 MG Tabs tablet Commonly known as: TRADJENTA Take 0.5 tablets (2.5 mg total) by mouth 2 (two) times daily before a meal.    polyvinyl alcohol 1.4 % ophthalmic solution Commonly known as: LIQUIFILM TEARS Place 1 drop into both eyes as needed for dry eyes.    QUEtiapine 300 MG tablet Commonly known as: SEROQUEL Take 1 tablet (300 mg total) by mouth at bedtime. What changed:  medication strength how much to take         Follow-up Information     [facility], [facility]. Go on 07/29/2023.   Why: You are scheduled for 07/29/2023 at 10:00 AM. Please remember to bring your insurance card. Contact information: [address] [place] [address] [phone]                  PATIENTS CONDITION AT DISCHARGE:  Stable  TOBACCO CESSATION SCREENING  Patient was screened and counselled on smoking cessation at time of discharge.   PRESCRIPTION ARE LOCATED:  On Chart  DISCHARGE INSTRUCTIONS:  1. Diet: Cardiac healthy  2. Activity: As tolerated  3. Take medications as prescribed and not to make any changes without first consulting with the outpatient provider.  4. Patient was advised to avoid any illicit drugs or alcohol due to negative impact on physical and mental health.  5. Patient should keep all follow up  appointments.  TIME SPENT ON DISCHARGE: Over 35 minutes were spent on this patients discharge including a face to face encounter, patient counseling and preparation of discharge materials.  Signed: Lewanda Rife, MD

## 2023-07-24 NOTE — Care Management Important Message (Signed)
Important Message  Patient Details  Name: Pamela Benson MRN: [id number] Date of Birth: [birth year]   Medicare Important Message Given:  Yes     Elza Rafter, LCSWA 07/24/2023, 10:23 AM

## 2023-07-24 NOTE — Progress Notes (Signed)
  [facility] Adult Case Management Discharge Plan :  Will you be returning to the same living situation after discharge:  Yes,  pt will be returning home  At discharge, do you have transportation home?: Yes,  pt's sister will pick her up  Do you have the ability to pay for your medications: Yes,  [employer] MEDICARE / [facility] MEDICARE  Release of information consent forms completed and in the chart;  Patient's signature needed at discharge.  Patient to Follow up at:  Follow-up Information     [facility], [facility]. Go on 07/29/2023.   Why: You are scheduled for 07/29/2023 at 10:00 AM. Please remember to bring your insurance card. Contact information: [address] [place] [address] [phone]                 Next level of care provider has access to [facility] Link:no  Safety Planning and Suicide Prevention discussed: Yes,  Pt declined but SPE brochure gone over with pt at discharge and given to pt in discharge packet      Has patient been referred to the Quitline?: Patient does not use tobacco/nicotine products  Patient has been referred for addiction treatment: No known substance use disorder.  [address], LCSWA 07/24/2023, 10:26 AM

## 2023-07-27 NOTE — Group Note (Deleted)

## 2023-07-29 ENCOUNTER — Other Ambulatory Visit: Payer: Self-pay | Admitting: Nephrology

## 2023-07-29 MED ORDER — LEVOTHYROXINE SODIUM 75 MCG PO TABS: 75.0000 ug | ORAL_TABLET | Freq: Every day | ORAL | 11 refills | Status: AC

## 2023-08-19 ENCOUNTER — Other Ambulatory Visit: Payer: Self-pay

## 2023-08-19 ENCOUNTER — Emergency Department
Admission: EM | Admit: 2023-08-19 | Discharge: 2023-08-19 | Disposition: A | Discharge status: Home / Self Care | Payer: Medicare Other | Attending: Emergency Medicine | Admitting: Emergency Medicine

## 2023-08-19 ENCOUNTER — Encounter: Payer: Self-pay | Admitting: Physician Assistant

## 2023-08-19 DIAGNOSIS — E119 Type 2 diabetes mellitus without complications: Secondary | ICD-10-CM | POA: Insufficient documentation

## 2023-08-19 DIAGNOSIS — Z760 Encounter for issue of repeat prescription: Secondary | ICD-10-CM

## 2023-08-19 HISTORY — DX: Type 2 diabetes mellitus without complications: E11.9

## 2023-08-19 HISTORY — DX: Other specified disorders of bone density and structure, unspecified site: M85.80

## 2023-08-19 MED ORDER — LINAGLIPTIN 5 MG PO TABS: 2.5000 mg | ORAL_TABLET | Freq: Two times a day (BID) | ORAL | 0 refills | Status: DC

## 2023-08-19 MED ORDER — QUETIAPINE FUMARATE 300 MG PO TABS: 300.0000 mg | ORAL_TABLET | Freq: Every day | ORAL | 1 refills | Status: DC

## 2023-08-19 MED ORDER — ALENDRONATE SODIUM 70 MG PO TABS: 70.0000 mg | ORAL_TABLET | ORAL | 1 refills | Status: AC

## 2023-08-19 NOTE — ED Provider Notes (Signed)
[facility] Emergency Department Provider Note     Event Date/Time   First MD Initiated Contact with Patient 08/19/23 1523     (approximate)   History   Medication Refill   HPI  Pamela Benson is a 77 y.o. female patient with a history of diabetes, to the ED for request of medication refill.  Patient with a new history and diagnosis of Paranoia, has been evaluated in the ED with both an IVC and involuntary commitment for acute psychosis.  She was prescribed Seroquel and is scheduled to be evaluated as an inpatient in 2 weeks at [facility] clinic.  She is also requesting a refill of her Tradjenta at this time.  Patient denies any SI or HI.  Physical Exam   Triage Vital Signs: ED Triage Vitals [08/19/23 1458]  Encounter Vitals Group     BP (!) 143/64     Systolic BP Percentile      Diastolic BP Percentile      Pulse Rate 68     Resp 16     Temp 98.1 F (36.7 C)     Temp Source Oral     SpO2 98 %     Weight 230 lb (104.3 kg)     Height 5\' 4"  (1.626 m)     Head Circumference      Peak Flow      Pain Score      Pain Loc      Pain Education      Exclude from Growth Chart     Most recent vital signs: Vitals:   08/19/23 1458  BP: (!) 143/64  Pulse: 68  Resp: 16  Temp: 98.1 F (36.7 C)  SpO2: 98%    General Awake, no distress. NAD HEENT NCAT. PERRL. EOMI. No rhinorrhea. Mucous membranes are moist.  CV:  Good peripheral perfusion.  RESP:  Normal effort.  ABD:  No distention.  PSYCH: Normal thought process without evidence of any SI or HI   ED Results / Procedures / Treatments   Labs (all labs ordered are listed, but only abnormal results are displayed) Labs Reviewed - No data to display   EKG   RADIOLOGY   No results found.   PROCEDURES:  Critical Care performed: No  Procedures   MEDICATIONS ORDERED IN ED: Medications - No data to display   IMPRESSION / MDM / ASSESSMENT AND PLAN / ED COURSE  I reviewed the triage  vital signs and the nursing notes.                              Differential diagnosis includes, but is not limited to, med refill, acute psychosis, paranoia, electrolyte abnormality  Patient's presentation is most consistent with acute, uncomplicated illness.  Patient's diagnosis is consistent with med refill.  With reassuring exam at this time no evidence of acute mania or psychosis.  Patient will be discharged home with prescriptions for Fosamax, Tradjenta, and Seroquel as requested. Patient is to follow up with her selected PCP as scheduled, as needed or otherwise directed. Patient is given ED precautions to return to the ED for any worsening or new symptoms.   FINAL CLINICAL IMPRESSION(S) / ED DIAGNOSES   Final diagnoses:  Medication refill     Rx / DC Orders   ED Discharge Orders          Ordered    linagliptin (TRADJENTA) 5 MG TABS  tablet  2 times daily before meals        08/19/23 1620    QUEtiapine (SEROQUEL) 300 MG tablet  Daily at bedtime        08/19/23 1620    alendronate (FOSAMAX) 70 MG tablet  Weekly        08/19/23 1620             Note:  This document was prepared using Dragon voice recognition software and may include unintentional dictation errors.    Lissa Hoard, PA-C 08/19/23 2355    Corena Herter, MD 08/21/23 [phone]

## 2023-08-19 NOTE — ED Triage Notes (Signed)
Pt sts that her PCP has retired and she is not able to see her new PCP until the 16th of this month. Pt sts that she needs her seroquel and her tradjenta refilled.

## 2023-08-19 NOTE — Discharge Instructions (Addendum)
Follow-up with your primary provider as scheduled.

## 2023-08-19 NOTE — ED Notes (Signed)
See triage notes. Patient is in need of some prescription refills to get her through until she sees her new doctor on 10/16

## 2023-10-04 ENCOUNTER — Other Ambulatory Visit: Payer: Self-pay

## 2023-10-04 ENCOUNTER — Emergency Department
Admission: EM | Admit: 2023-10-04 | Discharge: 2023-10-05 | Disposition: A | Discharge status: Home / Self Care | Payer: Medicare Other | Attending: Emergency Medicine | Admitting: Emergency Medicine

## 2023-10-04 DIAGNOSIS — F23 Brief psychotic disorder: Secondary | ICD-10-CM | POA: Diagnosis present

## 2023-10-04 DIAGNOSIS — E119 Type 2 diabetes mellitus without complications: Secondary | ICD-10-CM | POA: Diagnosis not present

## 2023-10-04 DIAGNOSIS — F29 Unspecified psychosis not due to a substance or known physiological condition: Secondary | ICD-10-CM | POA: Diagnosis present

## 2023-10-04 DIAGNOSIS — R441 Visual hallucinations: Secondary | ICD-10-CM | POA: Insufficient documentation

## 2023-10-04 DIAGNOSIS — N390 Urinary tract infection, site not specified: Secondary | ICD-10-CM | POA: Diagnosis not present

## 2023-10-04 DIAGNOSIS — Z79899 Other long term (current) drug therapy: Secondary | ICD-10-CM | POA: Insufficient documentation

## 2023-10-04 DIAGNOSIS — R8271 Bacteriuria: Secondary | ICD-10-CM

## 2023-10-04 LAB — URINALYSIS, W/ REFLEX TO CULTURE (INFECTION SUSPECTED)
Bilirubin Urine: NEGATIVE
Glucose, UA: 50 mg/dL — AB
Ketones, ur: NEGATIVE mg/dL
Nitrite: NEGATIVE
Protein, ur: NEGATIVE mg/dL
Specific Gravity, Urine: 1.018 (ref 1.005–1.030)
pH: 5 (ref 5.0–8.0)

## 2023-10-04 LAB — CBC
HCT: 36.1 % (ref 36.0–46.0)
Hemoglobin: 11.5 g/dL — ABNORMAL LOW (ref 12.0–15.0)
MCH: 28.5 pg (ref 26.0–34.0)
MCHC: 31.9 g/dL (ref 30.0–36.0)
MCV: 89.6 fL (ref 80.0–100.0)
Platelets: 265 10*3/uL (ref 150–400)
RBC: 4.03 MIL/uL (ref 3.87–5.11)
RDW: 15.1 % (ref 11.5–15.5)
WBC: 6.5 10*3/uL (ref 4.0–10.5)
nRBC: 0 % (ref 0.0–0.2)

## 2023-10-04 LAB — URINE DRUG SCREEN, QUALITATIVE (ARMC ONLY)
Amphetamines, Ur Screen: NOT DETECTED
Barbiturates, Ur Screen: NOT DETECTED
Benzodiazepine, Ur Scrn: POSITIVE — AB
Cannabinoid 50 Ng, Ur ~~LOC~~: NOT DETECTED
Cocaine Metabolite,Ur ~~LOC~~: NOT DETECTED
MDMA (Ecstasy)Ur Screen: NOT DETECTED
Methadone Scn, Ur: NOT DETECTED
Opiate, Ur Screen: NOT DETECTED
Phencyclidine (PCP) Ur S: NOT DETECTED
Tricyclic, Ur Screen: NOT DETECTED

## 2023-10-04 LAB — COMPREHENSIVE METABOLIC PANEL
ALT: 15 U/L (ref 0–44)
AST: 19 U/L (ref 15–41)
Albumin: 3.5 g/dL (ref 3.5–5.0)
Alkaline Phosphatase: 74 U/L (ref 38–126)
Anion gap: 8 (ref 5–15)
BUN: 20 mg/dL (ref 8–23)
CO2: 23 mmol/L (ref 22–32)
Calcium: 8.4 mg/dL — ABNORMAL LOW (ref 8.9–10.3)
Chloride: 101 mmol/L (ref 98–111)
Creatinine, Ser: 1.35 mg/dL — ABNORMAL HIGH (ref 0.44–1.00)
GFR, Estimated: 41 mL/min — ABNORMAL LOW (ref >60)
Glucose, Bld: 174 mg/dL — ABNORMAL HIGH (ref 70–99)
Potassium: 3.5 mmol/L (ref 3.5–5.1)
Sodium: 132 mmol/L — ABNORMAL LOW (ref 135–145)
Total Bilirubin: 0.3 mg/dL (ref <1.2)
Total Protein: 7.5 g/dL (ref 6.5–8.1)

## 2023-10-04 LAB — SALICYLATE LEVEL: Salicylate Lvl: <7 mg/dL — ABNORMAL LOW (ref 7.0–30.0)

## 2023-10-04 LAB — ACETAMINOPHEN LEVEL: Acetaminophen (Tylenol), Serum: <10 ug/mL — ABNORMAL LOW (ref 10–30)

## 2023-10-04 LAB — ETHANOL: Alcohol, Ethyl (B): <10 mg/dL (ref <10)

## 2023-10-04 MED ORDER — ONDANSETRON HCL 4 MG PO TABS: 4.0000 mg | ORAL_TABLET | Freq: Three times a day (TID) | ORAL | Status: DC | PRN

## 2023-10-04 MED ORDER — ALUM & MAG HYDROXIDE-SIMETH 200-200-20 MG/5ML PO SUSP: 30.0000 mL | Freq: Four times a day (QID) | ORAL | Status: DC | PRN

## 2023-10-04 MED ORDER — IBUPROFEN 600 MG PO TABS: 600.0000 mg | ORAL_TABLET | Freq: Three times a day (TID) | ORAL | Status: DC | PRN
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
## 2023-10-04 NOTE — ED Notes (Signed)
Pt belongings: 1 bag (red pajama shirt and pants, brown slippers) placed on cart in quad.

## 2023-10-04 NOTE — BH Assessment (Incomplete)
Comprehensive Clinical Assessment (CCA) Screening, Triage and Referral Note  10/04/2023 Pamela Benson [id number]  Chief Complaint:  Chief Complaint  Patient presents with  . Psychiatric Evaluation  . Hallucinations   Visit Diagnosis: ***  Patient Reported Information How did you hear about [place]? Self  What Is the Reason for Your Visit/Call Today? Patient brought to the ER because she stopped taking her medication and having A/H.  How Long Has This Been Causing You Problems? 1 wk - 1 month  What Do You Feel Would Help You the Most Today? Treatment for Depression or other mood problem   Have You Recently Had Any Thoughts About Hurting Yourself? No  Are You Planning to Commit Suicide/Harm Yourself At This time? No   Have you Recently Had Thoughts About Hurting Someone Pamela Benson? No  Are You Planning to Harm Someone at This Time? No  Explanation: No data recorded  Have You Used Any Alcohol or Drugs in the Past 24 Hours? No  How Long Ago Did You Use Drugs or Alcohol? No data recorded What Did You Use and How Much? No data recorded  Do You Currently Have a Therapist/Psychiatrist? No  Name of Therapist/Psychiatrist: No data recorded  Have You Been Recently Discharged From Any Office Practice or Programs? No  Explanation of Discharge From Practice/Program: No data recorded   CCA Screening Triage Referral Assessment Type of Contact: Face-to-Face  Telemedicine Service Delivery:   Is this Initial or Reassessment?   Date Telepsych consult ordered in [facility]:    Time Telepsych consult ordered in [facility]:    Location of Assessment: [facility] ED  Provider Location: [facility] ED    Collateral Involvement: No data recorded  Does Patient Have a Court Appointed Legal Guardian? No data recorded Name and Contact of Legal Guardian: No data recorded If Minor and Not Living with Parent(s), Who has Custody? No data recorded Is CPS involved or ever been involved? Never  Is APS involved or ever been  involved? Never   Patient Determined To Be At Risk for Harm To Self or Others Based on Review of Patient Reported Information or Presenting Complaint? No  Method: No data recorded Availability of Means: No data recorded Intent: No data recorded Notification Required: No data recorded Additional Information for Danger to Others Potential: No data recorded Additional Comments for Danger to Others Potential: No data recorded Are There Guns or Other Weapons in Your Home? No  Types of Guns/Weapons: No data recorded Are These Weapons Safely Secured?                            No  Who Could Verify You Are Able To Have These Secured: No data recorded Do You Have any Outstanding Charges, Pending Court Dates, Parole/Probation? No data recorded Contacted To Inform of Risk of Harm To Self or Others: No data recorded  Does Patient Present under Involuntary Commitment? Yes    [place] of Residence: [place]   Patient Currently Receiving the Following Services: Not Receiving Services   Determination of Need: Emergent (2 hours)   Options For Referral: ED Visit; Inpatient Hospitalization   Discharge Disposition:     Pamela Benson, LCAS
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
## 2023-10-05 DIAGNOSIS — N390 Urinary tract infection, site not specified: Secondary | ICD-10-CM | POA: Diagnosis present

## 2023-10-05 MED ORDER — CEPHALEXIN 500 MG PO CAPS
500.0000 mg | ORAL_CAPSULE | Freq: Four times a day (QID) | ORAL | Status: DC
  Administered 2023-10-05 (×3): 500 mg via ORAL
  Filled 2023-10-05 (×3): qty 1

## 2023-10-05 MED ORDER — CEPHALEXIN 500 MG PO CAPS: 500.0000 mg | ORAL_CAPSULE | Freq: Four times a day (QID) | ORAL | 0 refills | Status: AC

## 2023-10-05 NOTE — ED Notes (Signed)
Returned belongings, ambulated to lobby with RN and sister took patient home. Verified correct patient and correct discharge papers given. Pt alert and oriented X 4, stable for discharge. RR even and unlabored, color WNL. Discussed discharge instructions and follow-up as directed. Discharge medications discussed, when prescribed. Pt had opportunity to ask questions, and RN available to provide patient and/or family education.

## 2023-10-05 NOTE — Consult Note (Signed)
The client was diagnosed with a UTI and was experiencing hallucinations, psych cleared last night if she remained stable which she has.  Treatment is place with no psychosis, suicidal/homicidal ideations, or substance abuse.  Her nephew called and they want her to discharge as she has a neurology appointment on Wednesday and have been waiting for awhile for this.  Psych cleared.  Nanine Means, PMHNP

## 2023-10-05 NOTE — ED Notes (Signed)
Kerrie Buffalo Nephew Emergency Contact [phone]    Called to inform RN that pt has neurology appt Wednesday that they have been awaiting for quite some time. He also reports him or Darl Pikes are available for collateral should the psych team need further information.

## 2023-10-05 NOTE — ED Notes (Signed)
Pt given lunch tray.

## 2023-10-05 NOTE — ED Provider Notes (Signed)
Emergency Medicine Observation Re-evaluation Note  Pamela Benson is a 77 y.o. female, seen on rounds today.  Pt initially presented to the ED for complaints of Psychiatric Evaluation and Hallucinations Currently, the patient is resting, voices no medical complaints.  Physical Exam  BP (!) 114/55   Pulse 76   Temp 98.3 F (36.8 C) (Oral)   Resp 18   Wt 108 kg   SpO2 96%   BMI 40.85 kg/m  Physical Exam General: Resting in no acute distress Cardiac: No cyanosis Lungs: Equal rise and fall Psych: Not agitated  ED Course / MDM  EKG:   I have reviewed the labs performed to date as well as medications administered while in observation.  Recent changes in the last 24 hours include no events overnight.  Plan  Current plan is for psychiatric disposition.    Irean Hong, MD 10/05/23 [phone]

## 2023-10-05 NOTE — ED Notes (Signed)
IVC PAPERS   RESCINDED  PER  J  LORD NP  INFORMED  ALLY  RN

## 2023-10-05 NOTE — ED Notes (Signed)
PT IS IVC/ PENDING OVERNIGHT OBSERVATION AND DISCHARGE THIS MORNING.

## 2023-10-05 NOTE — ED Notes (Signed)
Pamela Benson Sister Emergency Contact [phone]   Sister to pickup patient approx [phone]

## 2023-10-05 NOTE — ED Notes (Addendum)
Breakfast tray provided to patient, Pt ate 100% of meal provided.

## 2023-10-05 NOTE — Discharge Instructions (Signed)
Please take the medication has been prescribed to your pharmacy for UTI.

## 2023-10-05 NOTE — ED Provider Notes (Signed)
-----------------------------------------   11:45 AM on 10/05/2023 -----------------------------------------   Blood pressure 105/60, pulse (!) 50, temperature (!) 97.5 F (36.4 C), temperature source Oral, resp. rate 16, weight 108 kg, SpO2 100%.  The patient is calm and cooperative at this time.  There have been no acute events since the last update.  Patient evaluated by psychiatry team.  They do feel patient is safe for discharge at this time and will resend IVC.  Was given outpatient follow-up.  Given strict return precautions.   Janith Lima, MD 10/05/23 1145

## 2023-10-07 LAB — URINE CULTURE: Culture: >=100000 — AB

## 2023-11-03 ENCOUNTER — Ambulatory Visit: Payer: Self-pay | Admitting: Urology

## 2023-11-04 ENCOUNTER — Ambulatory Visit: Payer: No Typology Code available for payment source | Admitting: Urology

## 2023-11-04 DIAGNOSIS — Z09 Encounter for follow-up examination after completed treatment for conditions other than malignant neoplasm: Secondary | ICD-10-CM | POA: Diagnosis not present

## 2023-11-04 DIAGNOSIS — N390 Urinary tract infection, site not specified: Secondary | ICD-10-CM

## 2023-11-04 LAB — MICROSCOPIC EXAMINATION: Epithelial Cells (non renal): >10 /[HPF] — AB (ref 0–10)

## 2023-11-04 LAB — URINALYSIS, COMPLETE
Bilirubin, UA: NEGATIVE
Glucose, UA: NEGATIVE
Ketones, UA: NEGATIVE
Nitrite, UA: NEGATIVE
Specific Gravity, UA: 1.025 (ref 1.005–1.030)
Urobilinogen, Ur: 0.2 mg/dL (ref 0.2–1.0)
pH, UA: 5.5 (ref 5.0–7.5)

## 2023-11-04 LAB — BLADDER SCAN AMB NON-IMAGING: Scan Result: 44

## 2023-11-04 MED ORDER — ESTRADIOL 0.1 MG/GM VA CREA: TOPICAL_CREAM | VAGINAL | 12 refills | Status: DC

## 2023-11-04 NOTE — Progress Notes (Signed)
Pamela Benson,acting as a scribe for Pamela Scotland, MD.,have documented all relevant documentation on the behalf of Pamela Scotland, MD,as directed by  Pamela Scotland, MD while in the presence of Pamela Scotland, MD.  11/04/23 8:29 AM   Pamela Benson 07/16/1946 [id number]  Referring provider: Dorothey Baseman, MD [phone] [address] [place],  [place] [address]  Chief Complaint  Patient presents with   Establish Care   Recurrent UTI    HPI: 77 year-old female who is referred for further evaluation of recurrent urinary tract infections.   She has been seen primarily in the emergency room multiple times with hallucinations precipitated by her urinary tract infections. Her most recent urine culture on 10/04/2023 grew pan-sensitive E.coli. No recent upper tract imaging. She had a very remote renal ultrasound back in 2009 which was unremarkable.   Her PVR today is 0 mL. Her urinalysis today appears contaminated with >10 epithelial cells, 6-10 WBC, otherwise unremarkable.  She is not currently on topical estrogen cream. She reports some urinary leakage. She denies consistent symptoms of burning, urgency, or frequency, but acknowledges occasional symptoms. She is unsure of the onset of infections and does not consistently experience urinary symptoms.  She is accompanied today by her sister.    Results for orders placed or performed in visit on 11/04/23  Microscopic Examination   Urine  Result Value Ref Range   WBC, UA 6-10 (A) 0 - 5 /hpf   RBC, Urine 0-2 0 - 2 /hpf   Epithelial Cells (non renal) >10 (A) 0 - 10 /hpf   Casts Present (A) None seen /lpf   Cast Type Granular casts (A) N/A   Bacteria, UA Many (A) None seen/Few   Yeast, UA Present (A) None seen  Urinalysis, Complete  Result Value Ref Range   Specific Gravity, UA 1.025 1.005 - 1.030   pH, UA 5.5 5.0 - 7.5   Color, UA Yellow Yellow   Appearance Ur Hazy (A) Clear   Leukocytes,UA Trace (A) Negative   Protein,UA 1+ (A)  Negative/Trace   Glucose, UA Negative Negative   Ketones, UA Negative Negative   RBC, UA Trace (A) Negative   Bilirubin, UA Negative Negative   Urobilinogen, Ur 0.2 0.2 - 1.0 mg/dL   Nitrite, UA Negative Negative   Microscopic Examination See below:   Bladder Scan (Post Void Residual) in office  Result Value Ref Range   Scan Result 44 ml     PMH: Past Medical History:  Diagnosis Date   Diabetes mellitus without complication (HCC)    Osteopenia after menopause     Home Medications:  Allergies as of 11/04/2023   No Known Allergies      Medication List        Accurate as of November 04, 2023 11:59 PM. If you have any questions, ask your nurse or doctor.          STOP taking these medications    eye wash Soln   linagliptin 5 MG Tabs tablet Commonly known as: TRADJENTA   risperiDONE 0.5 MG tablet Commonly known as: RISPERDAL       TAKE these medications    alendronate 70 MG tablet Commonly known as: FOSAMAX Take 70 mg by mouth once a week.   diazepam 5 MG tablet Commonly known as: VALIUM Take 5 mg by mouth every 8 (eight) hours as needed.   estradiol 0.1 MG/GM vaginal cream Commonly known as: ESTRACE Estrogen Cream Instruction Discard applicator Apply pea sized amount  to tip of finger to urethra before bed. Wash hands well after application. Use Monday, Wednesday and Friday   levothyroxine 75 MCG tablet Commonly known as: SYNTHROID Take 1 tablet (75 mcg total) by mouth daily at 6 (six) AM.   losartan 25 MG tablet Commonly known as: COZAAR Take 25 mg by mouth daily.   polyvinyl alcohol 1.4 % ophthalmic solution Commonly known as: LIQUIFILM TEARS Place 1 drop into both eyes as needed for dry eyes.   QUEtiapine 400 MG tablet Commonly known as: SEROQUEL Take 400 mg by mouth at bedtime. What changed: Another medication with the same name was removed. Continue taking this medication, and follow the directions you see here.         Social  History:  reports that she has never smoked. She has never used smokeless tobacco. She reports that she does not currently use alcohol. No history on file for drug use.   Physical Exam: Constitutional:  Alert and oriented, No acute distress.  Accompanied by sister today.   HEENT: [place] AT, moist mucus membranes.  Trachea midline, no masses. Neurologic: Grossly intact, no focal deficits, moving all 4 extremities. Psychiatric: Normal mood and affect.   Assessment & Plan:    1. Recurrent UTI vs. Chronic bacterial colonization - Start Estrace topical estrogen cream, pea-sized amount 3 times a week.  - At this point, we can defer additional imaging or cystoscopy as she is emptying well with a PVR of 44 mL - Suspect bacterial colonization versus true recurrent UTI; asymptomatic today - It is unclear whether her mental health/hallucinations are related to urinary tract infections. It would be helpful to know if she is chronically colonized. We will go ahead and send a culture today to test that theory. She never has any lower urinary tract symptoms, which is suspicious. Regardless, we will treat her for recurrent UTI's.   - Recurrent UTI Prevention Strategies  Stay well hydrated. Get a moderate amount of exercise. Eat a diet rich in fruit and vegetables. Start a bowel regimen to manage your constipation. Your goal is to have consistent, formed bowel movements that are easy for you to pass. You may use either of the over-the-counter supplements Benefiber or Miralax to help with this. I recommend that you try Benefiber first and move on to Miralax if this is not helping you enough. You may adjust the recommended dose of Miralax (one capful daily) to achieve this goal. Start taking an over-the-counter cranberry supplement for urinary tract health. Take this once or twice daily on an empty stomach, e.g. right before bed. Start taking an over-the-counter d-mannose supplement. Take this daily per packaging  instructions. Start taking an over-the-counter probiotic containing the bacterial species called Lactobacillus. Take this daily. Start vaginal estrogen cream. Apply a pea-sized amount around the opening of the urethra every day for 2 weeks, then three times weekly forever.   Return in about 6 weeks (around 12/16/2023) for efficacy of preventative measures and review of urine culture results.  I have reviewed the above documentation for accuracy and completeness, and I agree with the above.   Pamela Scotland, MD    [facility] [address] [place], [place] [address] [phone]

## 2023-11-04 NOTE — Patient Instructions (Signed)
For UTI prevention take cranberry tablets, probiotic, D-Mannose daily.  

## 2023-11-06 ENCOUNTER — Emergency Department
Admission: EM | Admit: 2023-11-06 | Discharge: 2023-11-06 | Disposition: A | Discharge status: Home / Self Care | Payer: Medicare Other | Attending: Emergency Medicine | Admitting: Emergency Medicine

## 2023-11-06 ENCOUNTER — Other Ambulatory Visit: Payer: Self-pay

## 2023-11-06 DIAGNOSIS — R440 Auditory hallucinations: Secondary | ICD-10-CM | POA: Diagnosis not present

## 2023-11-06 DIAGNOSIS — F22 Delusional disorders: Secondary | ICD-10-CM | POA: Diagnosis not present

## 2023-11-06 DIAGNOSIS — N390 Urinary tract infection, site not specified: Secondary | ICD-10-CM | POA: Diagnosis not present

## 2023-11-06 DIAGNOSIS — F29 Unspecified psychosis not due to a substance or known physiological condition: Secondary | ICD-10-CM | POA: Insufficient documentation

## 2023-11-06 DIAGNOSIS — F23 Brief psychotic disorder: Secondary | ICD-10-CM | POA: Diagnosis not present

## 2023-11-06 DIAGNOSIS — E119 Type 2 diabetes mellitus without complications: Secondary | ICD-10-CM | POA: Diagnosis not present

## 2023-11-06 LAB — COMPREHENSIVE METABOLIC PANEL
ALT: 15 U/L (ref 0–44)
AST: 19 U/L (ref 15–41)
Albumin: 3.7 g/dL (ref 3.5–5.0)
Alkaline Phosphatase: 92 U/L (ref 38–126)
Anion gap: 12 (ref 5–15)
BUN: 19 mg/dL (ref 8–23)
CO2: 26 mmol/L (ref 22–32)
Calcium: 8.7 mg/dL — ABNORMAL LOW (ref 8.9–10.3)
Chloride: 97 mmol/L — ABNORMAL LOW (ref 98–111)
Creatinine, Ser: 1.35 mg/dL — ABNORMAL HIGH (ref 0.44–1.00)
GFR, Estimated: 41 mL/min — ABNORMAL LOW (ref >60)
Glucose, Bld: 192 mg/dL — ABNORMAL HIGH (ref 70–99)
Potassium: 3.3 mmol/L — ABNORMAL LOW (ref 3.5–5.1)
Sodium: 135 mmol/L (ref 135–145)
Total Bilirubin: 0.5 mg/dL (ref <1.2)
Total Protein: 7.8 g/dL (ref 6.5–8.1)

## 2023-11-06 LAB — ETHANOL: Alcohol, Ethyl (B): <10 mg/dL (ref <10)

## 2023-11-06 LAB — CBC
HCT: 36.5 % (ref 36.0–46.0)
Hemoglobin: 11.7 g/dL — ABNORMAL LOW (ref 12.0–15.0)
MCH: 28.2 pg (ref 26.0–34.0)
MCHC: 32.1 g/dL (ref 30.0–36.0)
MCV: 88 fL (ref 80.0–100.0)
Platelets: 267 10*3/uL (ref 150–400)
RBC: 4.15 MIL/uL (ref 3.87–5.11)
RDW: 14.7 % (ref 11.5–15.5)
WBC: 9.1 10*3/uL (ref 4.0–10.5)
nRBC: 0 % (ref 0.0–0.2)

## 2023-11-06 LAB — URINALYSIS, ROUTINE W REFLEX MICROSCOPIC
Bacteria, UA: NONE SEEN
Bilirubin Urine: NEGATIVE
Glucose, UA: 50 mg/dL — AB
Ketones, ur: NEGATIVE mg/dL
Nitrite: NEGATIVE
Protein, ur: NEGATIVE mg/dL
Specific Gravity, Urine: 1.017 (ref 1.005–1.030)
pH: 5 (ref 5.0–8.0)

## 2023-11-06 LAB — URINE DRUG SCREEN, QUALITATIVE (ARMC ONLY)
Amphetamines, Ur Screen: NOT DETECTED
Barbiturates, Ur Screen: NOT DETECTED
Benzodiazepine, Ur Scrn: POSITIVE — AB
Cannabinoid 50 Ng, Ur ~~LOC~~: NOT DETECTED
Cocaine Metabolite,Ur ~~LOC~~: NOT DETECTED
MDMA (Ecstasy)Ur Screen: NOT DETECTED
Methadone Scn, Ur: NOT DETECTED
Opiate, Ur Screen: NOT DETECTED
Phencyclidine (PCP) Ur S: NOT DETECTED
Tricyclic, Ur Screen: NOT DETECTED

## 2023-11-06 LAB — ACETAMINOPHEN LEVEL: Acetaminophen (Tylenol), Serum: <10 ug/mL — ABNORMAL LOW (ref 10–30)

## 2023-11-06 LAB — SALICYLATE LEVEL: Salicylate Lvl: <7 mg/dL — ABNORMAL LOW (ref 7.0–30.0)

## 2023-11-06 MED ORDER — CEPHALEXIN 500 MG PO CAPS: 500.0000 mg | ORAL_CAPSULE | Freq: Four times a day (QID) | ORAL | 0 refills | Status: AC

## 2023-11-06 MED ORDER — QUETIAPINE FUMARATE 200 MG PO TABS
400.0000 mg | ORAL_TABLET | Freq: Every day | ORAL | Status: DC
  Administered 2023-11-06: 400 mg via ORAL
  Filled 2023-11-06: qty 2

## 2023-11-06 MED ORDER — CEPHALEXIN 500 MG PO CAPS
500.0000 mg | ORAL_CAPSULE | Freq: Four times a day (QID) | ORAL | Status: DC
  Administered 2023-11-06 (×2): 500 mg via ORAL
  Filled 2023-11-06 (×2): qty 1
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
## 2023-11-06 NOTE — BH Assessment (Signed)
TTS spoke with the patient's nephew Trey Paula), no concerns with her discharging. The nephew will follow up with her team's care providers at [facility].

## 2023-11-06 NOTE — ED Provider Notes (Signed)
NP White, psychiatry nurse practitioner advises discharge to home with outpatient follow-up.  I will continue cephalexin prescription in case urinary tract infection is present though no compelling symptoms and equivocal urinalysis.  Patient seated in wheelchair resting comfortably at this time.  Understanding of the plan to treat with antibiotics and careful return precautions.  She advises [facility] in [place] is her preferred pharmacy  Return precautions and treatment recommendations and follow-up discussed with the patient who is agreeable with the plan.  She is alert oriented in no distress appears appropriate for outpatient treatment     Sharyn Creamer, MD 11/06/23 1528
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
## 2023-11-06 NOTE — Consult Note (Signed)
Pamela Benson  Patient Name: Pamela Benson MRN: [id number] DOB: 1946-04-06 DATE OF Consult: 11/06/2023  PRIMARY PSYCHIATRIC DIAGNOSES  1.  Acute Psychosis    RECOMMENDATIONS  Recommendations: Medication recommendations:  -- Continue Seroquel 400mg  po at bedtime for psychosis -- 12/18 Risperdal was discontinued by urologist   Non-Medication/therapeutic recommendations:  -- UA/ UDS pending at this time. Awaiting results.  -- Plan to monitor overnight and reassess in the morning.  -- Concerned given severity of psychosis. May consider IVC petition as patient is not agreeable for treatment at this time, if medically cleared.   Is inpatient psychiatric hospitalization recommended for this patient?  -- Patient does meet criteria for inpatient psychiatric hospitalization at this time. Awaiting medical clearance.   Communication: Treatment team members (and family members if applicable) who were involved in treatment/care discussions and planning, and with whom we spoke or engaged with via secure text/chat, include the following: ED primary team  Thank you for involving [place] in the care of this patient. If you have any additional questions or concerns, please call [phone] and ask for me or the provider on-call.  TELEPSYCHIATRY ATTESTATION & CONSENT  As the provider for this telehealth consult, I attest that I verified the patient's identity using two separate identifiers, introduced myself to the patient, provided my credentials, disclosed my location, and performed this encounter via a HIPAA-compliant, real-time, face-to-face, two-way, interactive audio and video platform and with the full consent and agreement of the patient (or guardian as applicable.)  Patient physical location: ED in [facility]. Telehealth provider physical location: home office in state of [place]  Video start time: 0105 (Central Time) Video end time: 0128 (Central Time)   IDENTIFYING DATA  Pamela Benson is a 77 y.o. year-old female for whom a psychiatric consultation has been ordered by the primary provider. The patient was identified using two separate identifiers.  CHIEF COMPLAINT/REASON FOR CONSULT  Hallucinations, Paranoia    HISTORY OF PRESENT ILLNESS (HPI)  The patient is a 77yo female who presented to the emergency department, accompanied by law enforcement, voluntarily, for concern of paranoia and hallucinations. Patient believes her neighbors have been verbally threatening her and since January she has been hearing voices threatening to kill her. She believes this is her neighbor and a woman who visits her neighbor.   Patient states "someone was trying to steal my antique rod-iron porch furniture". States the person was female and "scary" however states she actually never the saw this person but knows it is two people and they live next door. Patient states these two people were "hollering" all at each and she heard the female individua say "I'm going to screw her until she dies". Patient states this upset her and she is terrified. She also heard the woman say "I'm going to kill her". Patient states when this individual came to her back porch they tried to get into the back door of her home but did not succeed. She locked herself in her laundry room due to fear. Patient states she knows the female individual is named "Winfield Rast". She believes they are "playing games to aggravate me". She believes these people to be on drugs and states they want her furniture so they can "melt it down for four thousand dollars".   Patient states her other neighbor called the police today. Patient states she feels safe at home only if these people would leave her alone. However, yesterday she does mention she left her  home to drive to the police station, sat in the parking lot because she didn't feel safe at home. States she sat there for an hour and then returned home. She denies  suicidal and homicidal ideations. No past attempts, no history of violence.   Patient states she has frequent urinary tract infections. States she recently went to urology where she tested negative for UTI but has multiple, consistent infections chronically. She states "she was going to grow a culture and see". Per chart review, this is confirmed was seen by Dr. Apolinar Junes 12/18. Also recently seen by neurology who questions whether sundowning behavior with psychosis.        PAST PSYCHIATRIC HISTORY  Past psychiatric history of acute psychosis. Also noted, history of acute psychosis in the presence of urinary tract infection.   Per chart review, she was admitted to [facility] 07/19/2023- 07/24/2023 for acute psychosis, similar presentation. 08/22/2023 seen at [facility] for acute psychosis but found to have UTI. Again, 11/17 seen in ED for acute psychosis but with UTI.   History of Risperdal 0.5mg  BID. Currently prescribed Seroquel 400mg  at bedtime.  No outpatient psychiatric services in place.   Otherwise as per HPI above.  PAST MEDICAL HISTORY  Past Medical History:  Diagnosis Date   Diabetes mellitus without complication (HCC)    Osteopenia after menopause      HOME MEDICATIONS  PTA Medications  Medication Sig   polyvinyl alcohol (LIQUIFILM TEARS) 1.4 % ophthalmic solution Place 1 drop into both eyes as needed for dry eyes.   levothyroxine (SYNTHROID) 75 MCG tablet Take 1 tablet (75 mcg total) by mouth daily at 6 (six) AM.   diazepam (VALIUM) 5 MG tablet Take 5 mg by mouth every 8 (eight) hours as needed.   losartan (COZAAR) 25 MG tablet Take 25 mg by mouth daily.   alendronate (FOSAMAX) 70 MG tablet Take 70 mg by mouth once a week.   QUEtiapine (SEROQUEL) 400 MG tablet Take 400 mg by mouth at bedtime.   estradiol (ESTRACE) 0.1 MG/GM vaginal cream Estrogen Cream Instruction Discard applicator Apply pea sized amount to tip of finger to urethra before bed. Wash hands well after application.  Use Monday, Wednesday and Friday   risperiDONE (RISPERDAL) 0.25 MG tablet Take 0.25 mg by mouth at bedtime.     ALLERGIES  No Known Allergies  SOCIAL & SUBSTANCE USE HISTORY  Social History   Socioeconomic History   Marital status: Widowed    Spouse name: Not on file   Number of children: Not on file   Years of education: Not on file   Highest education level: Not on file  Occupational History   Not on file  Tobacco Use   Smoking status: Never   Smokeless tobacco: Never  Substance and Sexual Activity   Alcohol use: Not Currently   Drug use: Not on file   Sexual activity: Not on file  Other Topics Concern   Not on file  Social History Narrative   Not on file   Social Drivers of Health   Financial Resource Strain: Medium Risk (10/07/2023)   Received from [facility]   Overall Financial Resource Strain (CARDIA)    Difficulty of Paying Living Expenses: Somewhat hard  Food Insecurity: No Food Insecurity (10/07/2023)   Received from [facility]   Hunger Vital Sign    Worried About Running Out of Food in the Last Year: Never true    Ran Out of Food in the Last  Year: Never true  Transportation Needs: No Transportation Needs (10/07/2023)   Received from [facility] - Transportation    In the past 12 months, has lack of transportation kept you from medical appointments or from getting medications?: No    Lack of Transportation (Non-Medical): No  Physical Activity: Not on file  Stress: Not on file  Social Connections: Not on file   Social History   Tobacco Use  Smoking Status Never  Smokeless Tobacco Never   Social History   Substance and Sexual Activity  Alcohol Use Not Currently   Social History   Substance and Sexual Activity  Drug Use Not on file    Additional pertinent information: lives alone.  FAMILY HISTORY  History reviewed. No pertinent family history. Family Psychiatric History (if  known):  None disclosed   MENTAL STATUS EXAM (MSE)  Mental Status Exam: General Appearance: Well Groomed  Orientation:  Full (Time, Place, and Person)  Memory:  Recent;   Good  Concentration:  Concentration: Good  Recall:  Good  Attention  Good  Eye Contact:  Good  Speech:  Clear and Coherent  Language:  Good  Volume:  Normal  Mood: Anxious  Affect:  Full Range  Thought Process:  Disorganized  Thought Content:  Delusions, Hallucinations: Auditory, and Paranoid Ideation  Suicidal Thoughts:  No  Homicidal Thoughts:  No  Judgement:  Impaired  Insight:  Lacking  Psychomotor Activity:  Normal  Akathisia:  No  Fund of Knowledge:  Fair    Assets:  Manufacturing systems engineer Desire for Improvement Housing Physical Health Social Support Transportation  Cognition:  Disorganized- psychosis   ADL's:  Intact  AIMS (if indicated):       VITALS  Blood pressure (!) 127/54, pulse 74, temperature 98 F (36.7 C), temperature source Oral, resp. rate 16, height 5\' 4"  (1.626 m), weight 108 kg, SpO2 98%.  LABS  Admission on 11/06/2023  Component Date Value Ref Range Status   Sodium 11/06/2023 135  135 - 145 mmol/L Final   Potassium 11/06/2023 3.3 (L)  3.5 - 5.1 mmol/L Final   Chloride 11/06/2023 97 (L)  98 - 111 mmol/L Final   CO2 11/06/2023 26  22 - 32 mmol/L Final   Glucose, Bld 11/06/2023 192 (H)  70 - 99 mg/dL Final   Glucose reference range applies only to samples taken after fasting for at least 8 hours.   BUN 11/06/2023 19  8 - 23 mg/dL Final   Creatinine, Ser 11/06/2023 1.35 (H)  0.44 - 1.00 mg/dL Final   Calcium 16/08/9603 8.7 (L)  8.9 - 10.3 mg/dL Final   Total Protein 54/07/8118 7.8  6.5 - 8.1 g/dL Final   Albumin 14/78/2956 3.7  3.5 - 5.0 g/dL Final   AST 21/30/8657 19  15 - 41 U/L Final   ALT 11/06/2023 15  0 - 44 U/L Final   Alkaline Phosphatase 11/06/2023 92  38 - 126 U/L Final   Total Bilirubin 11/06/2023 0.5  <1.2 mg/dL Final   GFR, Estimated 11/06/2023 41 (L)  >60 mL/min  Final   Comment: (Benson) Calculated using the CKD-EPI Creatinine Equation (2021)    Anion gap 11/06/2023 12  5 - 15 Final   Performed at [facility], [address] [place], [place] [address]   Alcohol, Ethyl (B) 11/06/2023 <10  <10 mg/dL Final   Comment: (Benson) Lowest detectable limit for serum alcohol is 10 mg/dL.  For medical purposes only. Performed at [facility], [address] [place], [place] [address]    Salicylate Lvl 11/06/2023 <7.0 (L)  7.0 - 30.0 mg/dL Final   Performed at [facility], [address] [place], [place] [address]   Acetaminophen (Tylenol), Serum 11/06/2023 <10 (L)  10 - 30 ug/mL Final   Comment: (Benson) Therapeutic concentrations vary significantly. A range of 10-30 ug/mL  may be an effective concentration for many patients. However, some  are best treated at concentrations outside of this range. Acetaminophen concentrations >150 ug/mL at 4 hours after ingestion  and >50 ug/mL at 12 hours after ingestion are often associated with  toxic reactions.  Performed at [facility], [address] [place], [place] [address]    WBC 11/06/2023 9.1  4.0 - 10.5 K/uL Final   RBC 11/06/2023 4.15  3.87 - 5.11 MIL/uL Final   Hemoglobin 11/06/2023 11.7 (L)  12.0 - 15.0 g/dL Final   HCT 91/47/8295 36.5  36.0 - 46.0 % Final   MCV 11/06/2023 88.0  80.0 - 100.0 fL Final   MCH 11/06/2023 28.2  26.0 - 34.0 pg Final   MCHC 11/06/2023 32.1  30.0 - 36.0 g/dL Final   RDW 62/13/0865 14.7  11.5 - 15.5 % Final   Platelets 11/06/2023 267  150 - 400 K/uL Final   nRBC 11/06/2023 0.0  0.0 - 0.2 % Final   Performed at [facility], [address] [place], [place] [address]    PSYCHIATRIC REVIEW OF SYSTEMS (ROS)  ROS: Notable for the following relevant positive findings: Review of Systems  Psychiatric/Behavioral:  Positive for hallucinations. Negative for suicidal ideas. The patient is nervous/anxious.     Additional  findings:      Musculoskeletal: No abnormal movements observed      Gait & Station: Laying/Sitting      Pain Screening: Denies      Nutrition & Dental Concerns: No concerns at this time  RISK FORMULATION/ASSESSMENT  Is the patient experiencing any suicidal or homicidal ideations: No       Explain if yes:  Protective factors considered for safety management: access to care, willingness for improvement, housing, social support, outpatient services, no access to guns, no history of violence or suicide attempts  Risk factors/concerns considered for safety management: acute psychosis  Physical illness/chronic pain Age over 54  Is there a safety management plan with the patient and treatment team to minimize risk factors and promote protective factors: Yes           Explain: Patient currently in the ED. Awaiting medical clearance- UA to rule out UTI. Will plan to monitor overnight with reassessment in the morning. If medically cleared- I do feel she would benefit from inpatient psychiatric admission.   Is crisis care placement or psychiatric hospitalization recommended: Yes, if medically cleared      Based on my current evaluation and risk assessment, patient is determined at this time to be at:  Moderate Risk  *RISK ASSESSMENT Risk assessment is a dynamic process; it is possible that this patient's condition, and risk level, may change. This should be re-evaluated and managed over time as appropriate. Please re-consult psychiatric consult services if additional assistance is needed in terms of risk assessment and management. If your team decides to discharge this patient, please advise the patient how to best access emergency psychiatric services, or to call 911, if their condition worsens or they feel unsafe in any way.   Assunta Gambles, NP Telepsychiatry Consult Services

## 2023-11-06 NOTE — ED Notes (Signed)
Pt has been calm and cooperative - is willing to participate in TTS assessment

## 2023-11-06 NOTE — ED Notes (Signed)
Pt dressed out in triage room 2 with this Clinical research associate and EDT Idalia Needle.  Pt dressed out into burgundy colored BH scrubs.  Pt belongings placed into pt belongings bag and labeled with pt labels.  Pt belongings: Long sleeve white shirt with flowers  [employer] blue sweatpants  Brown slippers  White socks  White bra  Dark blue zip up jacket Black purse  Black touch screen cell phone

## 2023-11-08 ENCOUNTER — Encounter: Payer: Self-pay | Admitting: Urology

## 2023-11-09 LAB — URINE CULTURE: Culture: 50000 — AB

## 2023-11-10 NOTE — Progress Notes (Addendum)
Called patient but no answer. Left a voice message with a call back number. Pt was sent home with cephalexin for a possible UTI. No compelling symptoms noted. The cultures are growing yeast and staph epi which are most likely a contaminant. UA had 6-10 squamous epithelial so urine may have not been a clean catch. Will recommend no escalate of abx needed and possibly can stop abx.   Discussed case with Dr. Lenard Lance, and he is in an agreement that this urine culture does not need to be treated.   Paschal Dopp, PharmD, BCPS

## 2023-12-23 ENCOUNTER — Encounter: Payer: Self-pay | Admitting: Urology

## 2023-12-23 ENCOUNTER — Ambulatory Visit: Payer: Medicare Other | Admitting: Urology

## 2023-12-23 VITALS — BP 127/72 | HR 73 | Ht 64.0 in | Wt 225.0 lb

## 2023-12-23 DIAGNOSIS — Z87440 Personal history of urinary (tract) infections: Secondary | ICD-10-CM

## 2023-12-23 DIAGNOSIS — R440 Auditory hallucinations: Secondary | ICD-10-CM

## 2023-12-23 DIAGNOSIS — N390 Urinary tract infection, site not specified: Secondary | ICD-10-CM

## 2023-12-23 LAB — URINALYSIS, COMPLETE
Bilirubin, UA: NEGATIVE
Glucose, UA: NEGATIVE
Ketones, UA: NEGATIVE
Leukocytes,UA: NEGATIVE
Nitrite, UA: NEGATIVE
Specific Gravity, UA: 1.02 (ref 1.005–1.030)
Urobilinogen, Ur: 0.2 mg/dL (ref 0.2–1.0)
pH, UA: 6 (ref 5.0–7.5)

## 2023-12-23 LAB — MICROSCOPIC EXAMINATION: Epithelial Cells (non renal): >10 /[HPF] — AB (ref 0–10)

## 2023-12-23 NOTE — Progress Notes (Signed)
 Pamela Benson,acting as a scribe for Rosina Riis, MD.,have documented all relevant documentation on the behalf of Rosina Riis, MD,as directed by  Rosina Riis, MD while in the presence of Rosina Riis, MD.  12/23/23 12:28 PM   Pamela Benson 1946-04-20 [id number]  Referring provider: Glover Lenis, MD [phone] [place],  [place] [address]  Chief Complaint  Patient presents with   Medication Management    HPI: 78 year-old female who returns today for follow up.   She was last seen in 10/2023. At that time, she was being evaluated for recurrent urinary tract infection. She was not using topical estrogen cream at that time.   Her urinalysis today is negative except for some squamous epithelial cells.   Essentially, she has been having some psychiatric issues which have been attributed to her psychosis from UTI's. Her most recent event was shortly after her last visit and she ended up back in the emergency room for these issues. At the time, her urine was somewhat contaminated and grew contaminants, including staph epidermis and yeast.   I reviewed records from Neurology and ER visits since her last visit. She was also seen in the emergency room for a yeast infection. She was also having auditory hallucinations, which is what brought her to the ER. Her urinalysis was contaminated, a urine culture grew mixed flora.   PMH: Past Medical History:  Diagnosis Date   Diabetes mellitus without complication (HCC)    Osteopenia after menopause       Home Medications:  Allergies as of 12/23/2023   No Known Allergies      Medication List        Accurate as of December 23, 2023 12:28 PM. If you have any questions, ask your nurse or doctor.          alendronate  70 MG tablet Commonly known as: FOSAMAX  Take 70 mg by mouth once a week.   diazepam 5 MG tablet Commonly known as: VALIUM Take 5 mg by mouth every 8 (eight) hours as needed.   divalproex  125 MG DR  tablet Commonly known as: DEPAKOTE  Take 125 mg twice a day   estradiol  0.1 MG/GM vaginal cream Commonly known as: ESTRACE  Estrogen Cream Instruction Discard applicator Apply pea sized amount to tip of finger to urethra before bed. Wash hands well after application. Use Monday, Wednesday and Friday   levothyroxine  75 MCG tablet Commonly known as: SYNTHROID  Take 1 tablet (75 mcg total) by mouth daily at 6 (six) AM.   losartan  25 MG tablet Commonly known as: COZAAR  Take 25 mg by mouth daily.   polyvinyl alcohol  1.4 % ophthalmic solution Commonly known as: LIQUIFILM TEARS Place 1 drop into both eyes as needed for dry eyes.   QUEtiapine  400 MG tablet Commonly known as: SEROQUEL  Take 400 mg by mouth at bedtime.   risperiDONE  0.25 MG tablet Commonly known as: RISPERDAL  Take 0.25 mg by mouth at bedtime.   simvastatin  20 MG tablet Commonly known as: ZOCOR  Take 20 mg by mouth at bedtime.         Social History:  reports that she has never smoked. She has never used smokeless tobacco. She reports that she does not currently use alcohol . No history on file for drug use.   Physical Exam: BP 127/72   Pulse 73   Ht 5' 4 (1.626 m)   Wt 225 lb (102.1 kg)   BMI 38.62 kg/m   Constitutional:  Alert and oriented, No  acute distress. HEENT: [place] AT, moist mucus membranes.  Trachea midline, no masses. Neurologic: Grossly intact, no focal deficits, moving all 4 extremities. Psychiatric: Normal mood and affect.   Assessment & Plan:    1. Recurrent UTI - Recent urinalysis and cultures have been negative for UTIs, indicating no current infection. - Reinforce the use of topical estrogen cream to be applied three times a week (Monday, Wednesday, Friday) to potentially prevent future UTIs. - Advised her to seek immediate medical attention if symptoms of UTI (burning, urgency, frequency) occur.  2. Auditory hallucinations - Unlikely associated with UTI as she does not currently have an  infection  - Continue current psychiatric management with Seroquel . - Encourage follow-up with neurology and psychiatry for further evaluation and management.  F/u prn  I have reviewed the above documentation for accuracy and completeness, and I agree with the above.   Rosina Riis, MD    [facility] [address] [place], [place] [address] [phone]

## 2024-08-15 ENCOUNTER — Emergency Department
Admission: EM | Admit: 2024-08-15 | Discharge: 2024-08-18 | Disposition: A | Discharge status: Other Acute Inpatient Hospital | Attending: Emergency Medicine | Admitting: Emergency Medicine

## 2024-08-15 ENCOUNTER — Other Ambulatory Visit: Payer: Self-pay

## 2024-08-15 DIAGNOSIS — Z91138 Patient's unintentional underdosing of medication regimen for other reason: Secondary | ICD-10-CM | POA: Insufficient documentation

## 2024-08-15 DIAGNOSIS — F02811 Dementia in other diseases classified elsewhere, unspecified severity, with agitation: Secondary | ICD-10-CM | POA: Insufficient documentation

## 2024-08-15 DIAGNOSIS — F1310 Sedative, hypnotic or anxiolytic abuse, uncomplicated: Secondary | ICD-10-CM | POA: Diagnosis not present

## 2024-08-15 DIAGNOSIS — F0282 Dementia in other diseases classified elsewhere, unspecified severity, with psychotic disturbance: Secondary | ICD-10-CM

## 2024-08-15 DIAGNOSIS — G309 Alzheimer's disease, unspecified: Secondary | ICD-10-CM | POA: Insufficient documentation

## 2024-08-15 DIAGNOSIS — F22 Delusional disorders: Secondary | ICD-10-CM | POA: Insufficient documentation

## 2024-08-15 DIAGNOSIS — E119 Type 2 diabetes mellitus without complications: Secondary | ICD-10-CM | POA: Insufficient documentation

## 2024-08-15 DIAGNOSIS — T426X6A Underdosing of other antiepileptic and sedative-hypnotic drugs, initial encounter: Secondary | ICD-10-CM | POA: Insufficient documentation

## 2024-08-15 DIAGNOSIS — Y900 Blood alcohol level of less than 20 mg/100 ml: Secondary | ICD-10-CM | POA: Insufficient documentation

## 2024-08-15 DIAGNOSIS — Z9183 Wandering in diseases classified elsewhere: Secondary | ICD-10-CM | POA: Diagnosis present

## 2024-08-15 DIAGNOSIS — Z79899 Other long term (current) drug therapy: Secondary | ICD-10-CM | POA: Diagnosis not present

## 2024-08-15 DIAGNOSIS — F0390 Unspecified dementia without behavioral disturbance: Secondary | ICD-10-CM | POA: Diagnosis not present

## 2024-08-15 LAB — COMPREHENSIVE METABOLIC PANEL WITH GFR
ALT: 16 U/L (ref 0–44)
AST: 26 U/L (ref 15–41)
Albumin: 3.8 g/dL (ref 3.5–5.0)
Alkaline Phosphatase: 64 U/L (ref 38–126)
Anion gap: 12 (ref 5–15)
BUN: 17 mg/dL (ref 8–23)
CO2: 22 mmol/L (ref 22–32)
Calcium: 9.3 mg/dL (ref 8.9–10.3)
Chloride: 105 mmol/L (ref 98–111)
Creatinine, Ser: 1.26 mg/dL — ABNORMAL HIGH (ref 0.44–1.00)
GFR, Estimated: 44 mL/min — ABNORMAL LOW (ref >60)
Glucose, Bld: 142 mg/dL — ABNORMAL HIGH (ref 70–99)
Potassium: 3.7 mmol/L (ref 3.5–5.1)
Sodium: 139 mmol/L (ref 135–145)
Total Bilirubin: 0.8 mg/dL (ref 0.0–1.2)
Total Protein: 7.8 g/dL (ref 6.5–8.1)

## 2024-08-15 LAB — URINALYSIS, W/ REFLEX TO CULTURE (INFECTION SUSPECTED)
Bacteria, UA: NONE SEEN
Bilirubin Urine: NEGATIVE
Glucose, UA: NEGATIVE mg/dL
Ketones, ur: NEGATIVE mg/dL
Nitrite: NEGATIVE
Protein, ur: NEGATIVE mg/dL
Specific Gravity, Urine: 1.009 (ref 1.005–1.030)
WBC, UA: 0 WBC/hpf (ref 0–5)
pH: 5 (ref 5.0–8.0)

## 2024-08-15 LAB — CBC
HCT: 36.3 % (ref 36.0–46.0)
Hemoglobin: 12.3 g/dL (ref 12.0–15.0)
MCH: 30.9 pg (ref 26.0–34.0)
MCHC: 33.9 g/dL (ref 30.0–36.0)
MCV: 91.2 fL (ref 80.0–100.0)
Platelets: 262 K/uL (ref 150–400)
RBC: 3.98 MIL/uL (ref 3.87–5.11)
RDW: 14.6 % (ref 11.5–15.5)
WBC: 10.9 K/uL — ABNORMAL HIGH (ref 4.0–10.5)
nRBC: 0 % (ref 0.0–0.2)

## 2024-08-15 LAB — ETHANOL: Alcohol, Ethyl (B): <15 mg/dL (ref <15)

## 2024-08-15 LAB — URINE DRUG SCREEN, QUALITATIVE (ARMC ONLY)
Amphetamines, Ur Screen: NOT DETECTED
Barbiturates, Ur Screen: NOT DETECTED
Benzodiazepine, Ur Scrn: POSITIVE — AB
Cannabinoid 50 Ng, Ur ~~LOC~~: NOT DETECTED
Cocaine Metabolite,Ur ~~LOC~~: NOT DETECTED
MDMA (Ecstasy)Ur Screen: NOT DETECTED
Methadone Scn, Ur: NOT DETECTED
Opiate, Ur Screen: NOT DETECTED
Phencyclidine (PCP) Ur S: NOT DETECTED
Tricyclic, Ur Screen: NOT DETECTED

## 2024-08-15 MED ORDER — ALUM & MAG HYDROXIDE-SIMETH 200-200-20 MG/5ML PO SUSP: 30.0000 mL | Freq: Four times a day (QID) | ORAL | Status: DC | PRN

## 2024-08-15 MED ORDER — ONDANSETRON HCL 4 MG PO TABS: 4.0000 mg | ORAL_TABLET | Freq: Three times a day (TID) | ORAL | Status: DC | PRN

## 2024-08-15 MED ORDER — IBUPROFEN 600 MG PO TABS: 600.0000 mg | ORAL_TABLET | Freq: Three times a day (TID) | ORAL | Status: DC | PRN

## 2024-08-15 NOTE — ED Provider Notes (Signed)
 [facility] Provider Note    Event Date/Time   First MD Initiated Contact with Patient 08/15/24 2304     (approximate)   History   Chief Complaint: Psychiatric Evaluation   HPI  Pamela Benson is a 78 y.o. female with a history of dementia is brought to the [facility] for involuntary commitment due to wandering in her neighborhood , agitation, reporting that she is looking for somebody.  No known injuries.  Outside records reviewed, seen by neurology July 12, 2024: - Increase Depakote to 250 mg in the morning, and 500 mg at night.  - Strongly recommended her to consider living in assisted facility.  - Seek immediate medical attention if she becomes a threat to herself or neighbors.  - Recommend only local driving.  - Resend URGENT referral to geriatric psych, call sister @ [phone] [place].  Follow up 3 weeks, Dr. Lane        Physical Exam   Triage Vital Signs: ED Triage Vitals  Encounter Vitals Group     BP 08/15/24 2059 134/73     Girls Systolic BP Percentile --      Girls Diastolic BP Percentile --      Boys Systolic BP Percentile --      Boys Diastolic BP Percentile --      Pulse Rate 08/15/24 2059 82     Resp 08/15/24 2059 18     Temp 08/15/24 2059 97.7 F (36.5 C)     Temp Source 08/15/24 2059 Oral     SpO2 08/15/24 2059 95 %     Weight 08/15/24 2058 245 lb (111.1 kg)     Height 08/15/24 2058 5' 4 (1.626 m)     Head Circumference --      Peak Flow --      Pain Score 08/15/24 2058 0     Pain Loc --      Pain Education --      Exclude from Growth Chart --     Most recent vital signs: Vitals:   08/15/24 2059  BP: 134/73  Pulse: 82  Resp: 18  Temp: 97.7 F (36.5 C)  SpO2: 95%    General: Awake, no distress. CV:  Good peripheral perfusion.  Resp:  Normal effort.  Abd:  No distention.  Other:  No wounds   ED Results / Procedures / Treatments   Labs (all labs ordered are listed, but only abnormal results are  displayed) Labs Reviewed  COMPREHENSIVE METABOLIC PANEL WITH GFR - Abnormal; Notable for the following components:      Result Value   Glucose, Bld 142 (*)    Creatinine, Ser 1.26 (*)    GFR, Estimated 44 (*)    All other components within normal limits  CBC - Abnormal; Notable for the following components:   WBC 10.9 (*)    All other components within normal limits  URINE DRUG SCREEN, QUALITATIVE ([facility] ONLY) - Abnormal; Notable for the following components:   Benzodiazepine, Ur Scrn POSITIVE (*)    All other components within normal limits  VALPROIC ACID LEVEL - Abnormal; Notable for the following components:   Valproic Acid Lvl <10 (*)    All other components within normal limits  URINALYSIS, W/ REFLEX TO CULTURE (INFECTION SUSPECTED) - Abnormal; Notable for the following components:   Color, Urine YELLOW (*)    APPearance HAZY (*)    Hgb urine dipstick SMALL (*)    Leukocytes,Ua TRACE (*)    All  other components within normal limits  ETHANOL     EKG    RADIOLOGY    PROCEDURES:  Procedures   MEDICATIONS ORDERED IN ED: Medications  ibuprofen  (ADVIL ) tablet 600 mg (has no administration in time range)  ondansetron  (ZOFRAN ) tablet 4 mg (has no administration in time range)  alum & mag hydroxide-simeth (MAALOX/MYLANTA) 200-200-20 MG/5ML suspension 30 mL (has no administration in time range)  levothyroxine  (SYNTHROID ) tablet 75 mcg (75 mcg Oral Given 08/16/24 0543)  losartan (COZAAR) tablet 25 mg (has no administration in time range)  QUEtiapine  (SEROQUEL ) tablet 400 mg (0 mg Oral Hold 08/16/24 0224)  simvastatin (ZOCOR) tablet 20 mg (0 mg Oral Hold 08/16/24 0225)  divalproex (DEPAKOTE) DR tablet 250 mg (0 mg Oral Hold 08/16/24 0225)     IMPRESSION / MDM / ASSESSMENT AND PLAN / ED COURSE  I reviewed the triage vital signs and the nursing notes.  Patient's presentation is most consistent with acute presentation with potential threat to life or bodily  function.  Brought to ED under IVC due to dementia, agitation.  Will request psychiatry evaluation.  Medically stable.  The patient has been placed in psychiatric observation due to the need to provide a safe environment for the patient while obtaining psychiatric consultation and evaluation, as well as ongoing medical and medication management to treat the patient's condition.  The patient has been placed under full IVC at this time.      FINAL CLINICAL IMPRESSION(S) / ED DIAGNOSES   Final diagnoses:  Alzheimer's dementia with psychotic disturbance, unspecified dementia severity, unspecified timing of dementia onset (HCC)     Rx / DC Orders   ED Discharge Orders     None        Note:  This document was prepared using Dragon voice recognition software and may include unintentional dictation errors.   Viviann Pastor, MD 08/16/24 [phone]

## 2024-08-15 NOTE — ED Notes (Signed)
 Pt belongings:  Black pants Black shirt White bra Occupational psychologist purse White panties

## 2024-08-15 NOTE — ED Triage Notes (Signed)
 Pt to ED via BPD under IVC, pt reports today she was looking for her friend who has been staying at her house throughout her neighborhood and then realized he was in the hospital pt reports she was not so nice. Per paperwork pt has hx dementia, pt was found wandering through peoples yards calling out for people who were not around, pt was going up to doors banging on them. Pt lives alone at home. Pt is calm and cooperative at this time.

## 2024-08-16 DIAGNOSIS — F0390 Unspecified dementia without behavioral disturbance: Secondary | ICD-10-CM

## 2024-08-16 DIAGNOSIS — F22 Delusional disorders: Secondary | ICD-10-CM | POA: Insufficient documentation

## 2024-08-16 LAB — VALPROIC ACID LEVEL: Valproic Acid Lvl: <10 ug/mL — ABNORMAL LOW (ref 50–100)

## 2024-08-16 MED ORDER — DIVALPROEX SODIUM 500 MG PO DR TAB
500.0000 mg | DELAYED_RELEASE_TABLET | Freq: Every evening | ORAL | Status: DC
  Administered 2024-08-16 – 2024-08-18 (×3): 500 mg via ORAL
  Filled 2024-08-16 (×3): qty 1

## 2024-08-16 MED ORDER — RISPERIDONE 1 MG PO TBDP
0.5000 mg | ORAL_TABLET | Freq: Every day | ORAL | Status: DC
  Administered 2024-08-16 – 2024-08-17 (×2): 0.5 mg via ORAL
  Filled 2024-08-16 (×3): qty 0.5

## 2024-08-16 MED ORDER — DIVALPROEX SODIUM 250 MG PO DR TAB
250.0000 mg | DELAYED_RELEASE_TABLET | Freq: Every morning | ORAL | Status: DC
  Administered 2024-08-17 – 2024-08-18 (×2): 250 mg via ORAL
  Filled 2024-08-16 (×2): qty 1

## 2024-08-16 MED ORDER — QUETIAPINE FUMARATE 200 MG PO TABS
400.0000 mg | ORAL_TABLET | Freq: Every day | ORAL | Status: DC
Start: 2024-08-16 — End: 2024-08-16

## 2024-08-16 MED ORDER — LEVOTHYROXINE SODIUM 50 MCG PO TABS
75.0000 ug | ORAL_TABLET | Freq: Every day | ORAL | Status: DC
Start: 2024-08-16 — End: 2024-08-18
  Administered 2024-08-16 – 2024-08-18 (×3): 75 ug via ORAL
  Filled 2024-08-16 (×3): qty 2

## 2024-08-16 MED ORDER — LOSARTAN POTASSIUM 50 MG PO TABS
25.0000 mg | ORAL_TABLET | Freq: Every day | ORAL | Status: DC
  Administered 2024-08-16 – 2024-08-18 (×3): 25 mg via ORAL
  Filled 2024-08-16 (×3): qty 1

## 2024-08-16 MED ORDER — QUETIAPINE FUMARATE 200 MG PO TABS
200.0000 mg | ORAL_TABLET | Freq: Every day | ORAL | Status: DC
  Administered 2024-08-16 – 2024-08-17 (×2): 200 mg via ORAL
  Filled 2024-08-16 (×2): qty 1

## 2024-08-16 MED ORDER — DIVALPROEX SODIUM 250 MG PO DR TAB
250.0000 mg | DELAYED_RELEASE_TABLET | Freq: Two times a day (BID) | ORAL | Status: DC
  Administered 2024-08-16: 250 mg via ORAL
  Filled 2024-08-16: qty 1

## 2024-08-16 MED ORDER — DIVALPROEX SODIUM 125 MG PO DR TAB: 125.0000 mg | DELAYED_RELEASE_TABLET | Freq: Two times a day (BID) | ORAL | Status: DC

## 2024-08-16 MED ORDER — SIMVASTATIN 10 MG PO TABS
20.0000 mg | ORAL_TABLET | Freq: Every day | ORAL | Status: DC
  Administered 2024-08-16 – 2024-08-17 (×2): 20 mg via ORAL
  Filled 2024-08-16 (×2): qty 2

## 2024-08-16 NOTE — ED Notes (Signed)
 Pt was provided with supper tray.

## 2024-08-16 NOTE — Consult Note (Signed)
 [facility] Psychiatric Consult Initial  Patient Name: .Pamela Benson  MRN: [id number]  DOB: [birth year]  Consult Order details:  Orders (From admission, onward)     Start     Ordered   08/15/24 2318  CONSULT TO CALL ACT TEAM       Ordering Provider: Viviann Pastor, MD  Provider:  (Not yet assigned)  Question:  Reason for Consult?  Answer:  Psych consult   08/15/24 2317   08/15/24 2318  IP CONSULT TO PSYCHIATRY       Ordering Provider: Viviann Pastor, MD  Provider:  (Not yet assigned)  Question Answer Comment  Consult Timeframe URGENT - requires response within 12 hours   URGENT timeframe requires provider to provider communication, has the provider to provider communication been completed Yes   Reason for Consult? Consult for medication management   Contact phone number where the requesting provider can be reached [phone]      08/15/24 2317             Mode of Visit: In person    Psychiatry Consult Evaluation  Service Date: August 16, 2024 LOS:  LOS: 0 days  Chief Complaint I got really upset at a woman who had taken a friend of mine.  Primary Psychiatric Diagnoses  Dementia Delusions   Assessment  Pamela Benson is a 78 y.o. female admitted: Presented to the ED  Per EDP note, Pamela Benson is a 78 y.o. female with a history of dementia is brought to the [facility] for involuntary commitment due to wandering in her neighborhood , agitation, reporting that she is looking for somebody.  No known injuries.   Outside records reviewed, seen by neurology July 12, 2024: - Increase Depakote to 250 mg in the morning, and 500 mg at night.  - Strongly recommended her to consider living in assisted facility.  - Seek immediate medical attention if she becomes a threat to herself or neighbors.  - Recommend only local driving.  - Resend URGENT referral to geriatric psych, call sister @ [phone] Devere.  Follow up 3 weeks, Dr. Lane   On assessment  today, patient is able to answer all my orientation questions correctly including her name, birthday, year, current president.  When asked what brought her in initially she reported she got really upset with a woman who had taken a friend of hers to her place.  Patient also reported her neighbors were packing up drugs in their basement.  Per collateral from sister, patient has been noted to be increasingly paranoid and accused family members of stealing her car, even though car is still in driveway.  She was seen recently by neurology, who made the above recommendations.  Patient was able to tell me that neurology mentioned she may be developing dementia.  She endorsed struggling with memory and reported neurologist told her something about veins not opening completely and brain.  Patient was brought in IVC.  Due to increasing paranoia, delusions, and increased agitation including yelling at neighbors and family, we will recommend inpatient admission for stabilization and management of medication titrations.  At this time, per [facility], there are no appropriate geropsych beds.  We will make medication adjustments and monitor patient while waiting in the emergency room.    Diagnoses:  Active Hospital problems: Active Problems:   Delusions (HCC)    Plan   ## Psychiatric Medication Recommendations:  - Increase Depakote to 250 mg in the morning, and 500 mg at night, per neurology recommendations -  Continue home Seroquel  200 mg at night -Continue home Risperdal  0.5 mg nightly   ## Medical Decision Making Capacity: Not specifically addressed in this encounter  ## Further Work-up:   -- Ordered, waiting on results for EKG -- Pertinent labwork reviewed earlier this admission includes: Valproic acid, cbc, ethanol, urine drug screen, urinalysis, cmp   ## Disposition:-- We recommend inpatient psychiatric hospitalization after medical hospitalization. Patient has been involuntarily committed on 08/15/2024.    ## Behavioral / Environmental: -Utilize compassion and acknowledge the patient's experiences while setting clear and realistic expectations for care.    ## Safety and Observation Level:  - Based on my clinical evaluation, I estimate the patient to be at low risk of self harm in the current setting. - At this time, we recommend  routine. This decision is based on my review of the chart including patient's history and current presentation, interview of the patient, mental status examination, and consideration of suicide risk including evaluating suicidal ideation, plan, intent, suicidal or self-harm behaviors, risk factors, and protective factors. This judgment is based on our ability to directly address suicide risk, implement suicide prevention strategies, and develop a safety plan while the patient is in the clinical setting. Please contact our team if there is a concern that risk level has changed.  CSSR Risk Category:C-SSRS RISK CATEGORY: No Risk  Suicide Risk Assessment: Patient has following modifiable risk factors for suicide: active mental illness (to encompass adhd, tbi, mania, psychosis, trauma reaction), which we are addressing by making medication adjustments and monitoring patient while waiting in the emergency department. Patient has following non-modifiable or demographic risk factors for suicide: N/A Patient has the following protective factors against suicide: Supportive family, no history of suicide attempts, and no history of NSSIB  Thank you for this consult request. Recommendations have been communicated to the primary team.  We will continue to monitor patient while waiting in the emergency department at this time.   Zelda Sharps, NP        History of Present Illness  Relevant Aspects of Hospital ED   Patient Report:  On assessment today, patient is able to answer all my orientation questions correctly including her name, birthday, year, current president.  When asked  what brought her in initially she reported she got really upset with a woman who had taken a friend of hers to her place.  Patient also reported her neighbors were packing up drugs in their basement.  Per collateral from sister, patient has been noted to be increasingly paranoid and accused family members of stealing her car, even though car is still in driveway.  She was seen recently by neurology, who made the above recommendations.  Patient was able to tell me that neurology mentioned she may be developing dementia.  She endorsed struggling with memory and reported neurologist told her something about veins not opening completely and brain.  Patient was able to tell me independently that she currently sees Dr. Lane, and that he recommended referral to geriatric psychiatrist, but they were unable to find her one. Patient was brought in IVC.  Patient reported she is usually compliant with medications, however she recently went on vacation with her friends to [place]  and she did not take her medications as scheduled.  On blood work, valproic acid is noted to be less than 10.  Patient reported she currently lives alone and cooks her own meals.  She reported having a sister named [name], she gave permission for collateral information to be  obtained from sister.  She denied any current suicidal or homicidal ideation.  She denied any access to firearms.  She did endorse screaming at her neighbor who she reported is scared of me anyway so I think it worked.  She denied any visual hallucinations.  When asked about auditory hallucinations, patient stated I can hear the neighbors, it is two ladies packing up drugs in the basement.  She also reported hearing neighbor state I am going to get you, I am going to kill you dead.  She denied any previous inpatient psychiatric hospitalizations.  She reported her husband died in 2024-11-16 of last year.  She is currently retired and receiving Tree surgeon.   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  She denied any recent alcohol  or illicit drug use.  She denied any tobacco use as well.  She denied any pending or previous legal charges.  Per TTS, Writer spoke to patient's sister, [name]. Devere says the delusions have worsened in the past month although they had someone [facility]) to come out in which they said patient did not need their services. Devere reports patient is able to care for herself and she does not have any concerns that she would try to harm herself or anyone else but patient is verbally aggressive. She states patient has accused her son and grandson of stealing her car and the car was in the drive way.     Psych ROS:  Depression: Denied Anxiety:  Denied Mania (lifetime and current): Denied Psychosis: (lifetime and current): Yes  Collateral information:  Contacted Devere at number listed in chart on 08/16/2024    Psychiatric and Social History  Psychiatric History:  Information collected from Patient/chart review  Prev Dx/Sx: Dementia Current Psych Provider: None Home Meds (current): Risperidone , seroquel , depakote Previous Med Trials: N/A Therapy: No  Prior Psych Hospitalization: No  Prior Self Harm: No Prior Violence: No, reported yelling/screaming at neighbors  Family Psych History: No Family Hx suicide: No  Social History:   Educational Hx: unknown Occupational Hx: Retired, receiving Pensions consultant Hx: Denied Living Situation: Lives alone Spiritual Hx: unknown Access to weapons/lethal means: Denied   Substance History Alcohol : Denied  Tobacco: Denied Illicit drugs: Denied Prescription drug abuse: Denied Rehab hx: Denied  Exam Findings  Physical Exam: Deferred to EDP- note reviewed   Vital Signs:  Temp:  [97.7 F (36.5 C)-97.9 F (36.6 C)] 97.9 F (36.6 C) (09/30 0714) Pulse Rate:  [65-82] 65 (09/30 0714) Resp:  [16-18] 16 (09/30 0714) BP: (129-134)/(49-73) 129/49 (09/30  0714) SpO2:  [95 %] 95 % (09/30 0714) Weight:  [111.1 kg] 111.1 kg (09/29 2058) Blood pressure (!) 129/49, pulse 65, temperature 97.9 F (36.6 C), temperature source Oral, resp. rate 16, height 5' 4 (1.626 m), weight 111.1 kg, SpO2 95%. Body mass index is 42.05 kg/m.    Mental Status Exam: General Appearance: Casual  Orientation:  Full (Time, Place, and Person)  Memory:  Immediate;   Fair Recent;   Fair Remote;   Fair  Concentration:  Concentration: Fair and Attention Span: Fair  Recall:  Fair  Attention  Fair  Eye Contact:  Good  Speech:  Clear and Coherent  Language:  Good  Volume:  Normal  Mood: I was just really upset  Affect:  Appropriate  Thought Process:  Coherent  Thought Content:  Delusions  Suicidal Thoughts:  No  Homicidal Thoughts:  No  Judgement:  Impaired  Insight:  Lacking  Psychomotor Activity:  Normal  Akathisia:  No  Fund of Knowledge:  Fair      Assets:  Therapist, sports  Cognition:  Impaired,  Moderate  ADL's:  Intact  AIMS (if indicated):        Other History   These have been pulled in through the EMR, reviewed, and updated if appropriate.  Family History:  The patient's family history is not on file.  Medical History: Past Medical History:  Diagnosis Date   Diabetes mellitus without complication (HCC)    Osteopenia after menopause     Surgical History: History reviewed. No pertinent surgical history.   Medications:   Current Facility-Administered Medications:    alum & mag hydroxide-simeth (MAALOX/MYLANTA) 200-200-20 MG/5ML suspension 30 mL, 30 mL, Oral, Q6H PRN, Viviann Pastor, MD   divalproex (DEPAKOTE) DR tablet 250 mg, 250 mg, Oral, Q12H, Viviann Pastor, MD, 250 mg at 08/16/24 9076   ibuprofen  (ADVIL ) tablet 600 mg, 600 mg, Oral, Q8H PRN, Viviann Pastor, MD   levothyroxine  (SYNTHROID ) tablet 75 mcg, 75 mcg, Oral, Q0600, Viviann Pastor, MD, 75 mcg at 08/16/24 0543    losartan (COZAAR) tablet 25 mg, 25 mg, Oral, Daily, Viviann Pastor, MD, 25 mg at 08/16/24 9076   ondansetron  (ZOFRAN ) tablet 4 mg, 4 mg, Oral, Q8H PRN, Viviann Pastor, MD   QUEtiapine  (SEROQUEL ) tablet 400 mg, 400 mg, Oral, QHS, Viviann Pastor, MD   simvastatin (ZOCOR) tablet 20 mg, 20 mg, Oral, QHS, Viviann Pastor, MD  Current Outpatient Medications:    divalproex (DEPAKOTE) 250 MG DR tablet, Take 250-500 mg by mouth.  Take 250 mg by mouth in the morning and 250 mg in the evening. 250 mg in the morning and 500 mg at night., Disp: , Rfl:    furosemide (LASIX) 20 MG tablet, Take 20 mg by mouth daily., Disp: , Rfl:    levothyroxine  (SYNTHROID ) 75 MCG tablet, Take 1 tablet (75 mcg total) by mouth daily at 6 (six) AM., Disp: 30 tablet, Rfl: 11   losartan (COZAAR) 25 MG tablet, Take 25 mg by mouth daily., Disp: , Rfl:    polyvinyl alcohol  (LIQUIFILM TEARS) 1.4 % ophthalmic solution, Place 1 drop into both eyes as needed for dry eyes., Disp: 15 mL, Rfl: 0   QUEtiapine  (SEROQUEL ) 200 MG tablet, Take 200 mg by mouth at bedtime., Disp: , Rfl:    risperiDONE  (RISPERDAL ) 0.5 MG tablet, Take 0.25 mg by mouth at bedtime., Disp: , Rfl:    simvastatin (ZOCOR) 20 MG tablet, Take 20 mg by mouth at bedtime., Disp: , Rfl:    alendronate  (FOSAMAX ) 70 MG tablet, Take 70 mg by mouth once a week. (Patient not taking: Reported on 08/16/2024), Disp: , Rfl:    diazepam (VALIUM) 5 MG tablet, Take 5 mg by mouth every 8 (eight) hours as needed. (Patient not taking: Reported on 08/16/2024), Disp: , Rfl:    divalproex (DEPAKOTE) 125 MG DR tablet, Take 125 mg twice a day, Disp: , Rfl:    estradiol  (ESTRACE ) 0.1 MG/GM vaginal cream, Estrogen Cream Instruction Discard applicator Apply pea sized amount to tip of finger to urethra before bed. Wash hands well after application. Use Monday, Wednesday and Friday (Patient not taking: Reported on 08/16/2024), Disp: 42.5 g, Rfl: 12   QUEtiapine  (SEROQUEL ) 400 MG tablet, Take 400  mg by mouth at bedtime., Disp: , Rfl:   Allergies: No Known Allergies  Zelda Sharps, NP

## 2024-08-16 NOTE — ED Notes (Signed)
IVC /psych consult pending 

## 2024-08-16 NOTE — BH Assessment (Signed)
 Writer spoke to patient's sister, [name]. Devere says the delusions have worsened in the past month although they had someone [facility]) to come out in which they said patient did not need their services. Devere reports patient is able to care for herself and she does not have any concerns that she would try to harm herself or anyone else but patient is verbally aggressive. She states patient has accused her son and grandson of stealing her car and the car was in the drive way.

## 2024-08-16 NOTE — ED Notes (Signed)
 Hospital meal provided, pt tolerated w/o complaints.  Waste discarded appropriately.

## 2024-08-16 NOTE — ED Notes (Signed)
 Pt refusing snack at this time. Pt calm and cooperative.

## 2024-08-16 NOTE — ED Notes (Signed)
 Pt given breakfast tray and juice.

## 2024-08-16 NOTE — BH Assessment (Signed)
 Comprehensive Clinical Assessment (CCA) Screening, Triage and Referral Note  08/16/2024 Carriann Hesse Wiechman [id number]  Pamela Benson, 78 year old female who presents to [facility] ED involuntarily for treatment. Per triage note, Pt to ED via BPD under IVC, pt reports today she was looking for her friend who has been staying at her house throughout her neighborhood and then realized he was in the hospital pt reports she was not so nice. Per paperwork pt has hx dementia, pt was found wandering through people's yards calling out for people who were not around, pt was going up to doors banging on them. Pt lives alone at home. Pt is calm and cooperative at this time.   During TTS assessment pt presents alert and oriented x 4, restless but cooperative, and mood-congruent with affect. The pt does not appear to be responding to internal or external stimuli. Neither is the pt presenting with any delusional thinking. Pt verified the information provided to triage RN.   Pt identifies her main complaint to be that she became upset with a neighbor "who took a friend that was recently discharged from the hospital." Patient states she started banging on doors trying to locate her friend. "I was not a happy camper." Patient says she lives alone and is able to care for herself. Patient reports she is compliant with her medications and takes them as prescribed; however, she missed a few doses while on vacation last week. Patient reports she is being followed by her neurologist. Patient says she is aware of her dementia dx. She says she forgets some things but eventually she is able to remember later in the day. Patient denies using any illicit substances and alcohol . Pt denies current SI/HI/AH/VH. Pt contracts for safety. Pt provided her sister, Devere as a collateral contact.    Per Zelda, NP,  pt is recommended for inpatient psychiatric admission for stabilization and management of medication titrations.  Chief Complaint:   Chief Complaint  Patient presents with   Psychiatric Evaluation   Visit Diagnosis: Dementia   Patient Reported Information How did you hear about us ? Other (Comment) Mudlogger)  What Is the Reason for Your Visit/Call Today? Patient was brought to ED due to patient wandering around in neighborhood, [place] neighbors.  How Long Has This Been Causing You Problems? > than 6 months  What Do You Feel Would Help You the Most Today? Medication(s)   Have You Recently Had Any Thoughts About Hurting Yourself? No  Are You Planning to Commit Suicide/Harm Yourself At This time? No   Have you Recently Had Thoughts About Hurting Someone Sherral? No  Are You Planning to Harm Someone at This Time? No  Explanation: n/a   Have You Used Any Alcohol  or Drugs in the Past 24 Hours? No  How Long Ago Did You Use Drugs or Alcohol ? No data recorded What Did You Use and How Much? n/a   Do You Currently Have a Therapist/Psychiatrist? No  Name of Therapist/Psychiatrist: Dr. Lane   Have You Been Recently Discharged From Any Office Practice or Programs? No  Explanation of Discharge From Practice/Program: n/a    CCA Screening Triage Referral Assessment Type of Contact: Face-to-Face  Telemedicine Service Delivery:   Is this Initial or Reassessment?   Date Telepsych consult ordered in [facility]:    Time Telepsych consult ordered in [facility]:    Location of Assessment: [facility] ED  Provider Location: [facility] ED    Collateral Involvement: Sister, Devere   Does Patient Have a Court  Appointed Legal Guardian? No data recorded Name and Contact of Legal Guardian: No data recorded If Minor and Not Living with Parent(s), Who has Custody? n/a  Is CPS involved or ever been involved? Never  Is APS involved or ever been involved? Never   Patient Determined To Be At Risk for Harm To Self or Others Based on Review of Patient Reported Information or Presenting Complaint? No  Method: No Plan  Availability  of Means: No access or NA  Intent: Vague intent or NA  Notification Required: No need or identified person  Additional Information for Danger to Others Potential: Active psychosis  Additional Comments for Danger to Others Potential: n/a  Are There Guns or Other Weapons in Your Home? No  Types of Guns/Weapons: n/a  Are These Weapons Safely Secured?                            No  Who Could Verify You Are Able To Have These Secured: n/a  Do You Have any Outstanding Charges, Pending Court Dates, Parole/Probation? None reported  Contacted To Inform of Risk of Harm To Self or Others: Other: Comment   Does Patient Present under Involuntary Commitment? Yes    [place] of Residence: [place]   Patient Currently Receiving the Following Services: Medication Management   Determination of Need: Emergent (2 hours)   Options For Referral: ED Visit   Disposition Recommendation per psychiatric provider: Per Zelda, NP, pt is recommended for inpatient psychiatric admission for stabilization and management of medication titrations.  Makar Slatter JONELLE Dolly, Counselor, LCAS-A

## 2024-08-16 NOTE — ED Notes (Signed)
 IVC  CONSULT  DONE  PENDING  PLACEMENT

## 2024-08-17 NOTE — ED Notes (Signed)
 Pt given PM snack anda cup of water at this time.

## 2024-08-17 NOTE — ED Notes (Signed)
 Pt provided lunch tray and a drink.

## 2024-08-17 NOTE — Progress Notes (Signed)
 Per AC Danika,  patient is not appropriate for gero unit at this time.  Patient has been referred to the following facilities:  Service Provider Phone  [facility]  [phone]  [facility]  [phone]  [facility]  [phone]  [facility]  [phone]  [facility]  [phone]  [facility]  [phone]  [facility]  [phone]  [facility]  [phone]  [facility]  [phone]  [facility]  [phone]  [facility]  [phone]  [facility]  [phone]    Gloria Lamber[place]on, [place] [phone]

## 2024-08-17 NOTE — ED Notes (Signed)
 Pt asking when she will be seeing the doctor since she has been waiting several days. Pt reassured that she has already seen an ED doctor and a Psych provider not long after she first got to the emergency department. Pt asking what the plan is for her. Pt reassured and told she would be updated as soon as we found out. Pt verbalized understanding.

## 2024-08-17 NOTE — ED Notes (Signed)
 Breakfast tray provided.

## 2024-08-17 NOTE — ED Notes (Signed)
 IVC/  PENDING  PLACEMENT

## 2024-08-17 NOTE — ED Notes (Signed)
 Dinner tray provided to pt

## 2024-08-17 NOTE — ED Provider Notes (Signed)
 Emergency Medicine Observation Re-evaluation Note  Pamela Benson is a 78 y.o. female, seen on rounds today.  Pt initially presented to the ED for complaints of Psychiatric Evaluation  Currently, the patient is is no acute distress. Denies any concerns at this time.  Physical Exam  Blood pressure 122/84, pulse 62, temperature 98.1 F (36.7 C), temperature source Oral, resp. rate 18, height 5' 4 (1.626 m), weight 111.1 kg, SpO2 96%.  Physical Exam: General: No apparent distress Pulm: Normal WOB Neuro: Moving all extremities Psych: Resting comfortably     ED Course / MDM     I have reviewed the labs performed to date as well as medications administered while in observation.  Recent changes in the last 24 hours include: No acute events overnight.  Plan   Current plan: Patient awaiting psychiatric disposition.  Patient under IVC.  Awaiting placement for psychiatric treatment.   Pamela Benson, Pamela SAILOR, DO 08/17/24 [phone]

## 2024-08-17 NOTE — ED Notes (Signed)
 Pt seen attempting to get up from stretcher. Assistance needed to help pt stand, but once up pt ambulated with a steady gait.

## 2024-08-17 NOTE — ED Notes (Signed)
 IVC/ rec psych inpt

## 2024-08-17 NOTE — ED Notes (Addendum)
 Pt asleep, PM snack not offered at this time. Will offer when pt awakens.

## 2024-08-18 ENCOUNTER — Other Ambulatory Visit: Payer: Self-pay

## 2024-08-18 ENCOUNTER — Encounter: Payer: Self-pay | Admitting: Psychiatry

## 2024-08-18 ENCOUNTER — Inpatient Hospital Stay
Admission: AD | Admit: 2024-08-18 | Discharge: 2024-08-26 | DRG: 885 | Disposition: A | Discharge status: Home / Self Care | Source: Intra-hospital | Attending: Psychiatry | Admitting: Psychiatry

## 2024-08-18 DIAGNOSIS — F03918 Unspecified dementia, unspecified severity, with other behavioral disturbance: Secondary | ICD-10-CM | POA: Diagnosis present

## 2024-08-18 DIAGNOSIS — Z9183 Wandering in diseases classified elsewhere: Secondary | ICD-10-CM | POA: Diagnosis not present

## 2024-08-18 DIAGNOSIS — Z634 Disappearance and death of family member: Secondary | ICD-10-CM | POA: Diagnosis not present

## 2024-08-18 DIAGNOSIS — Z79890 Hormone replacement therapy (postmenopausal): Secondary | ICD-10-CM

## 2024-08-18 DIAGNOSIS — Z604 Social exclusion and rejection: Secondary | ICD-10-CM | POA: Diagnosis present

## 2024-08-18 DIAGNOSIS — F0630 Mood disorder due to known physiological condition, unspecified: Secondary | ICD-10-CM | POA: Diagnosis not present

## 2024-08-18 DIAGNOSIS — F22 Delusional disorders: Secondary | ICD-10-CM | POA: Diagnosis present

## 2024-08-18 DIAGNOSIS — F0392 Unspecified dementia, unspecified severity, with psychotic disturbance: Secondary | ICD-10-CM | POA: Diagnosis present

## 2024-08-18 DIAGNOSIS — Z79899 Other long term (current) drug therapy: Secondary | ICD-10-CM | POA: Diagnosis not present

## 2024-08-18 DIAGNOSIS — Z7983 Long term (current) use of bisphosphonates: Secondary | ICD-10-CM | POA: Diagnosis not present

## 2024-08-18 LAB — VALPROIC ACID LEVEL: Valproic Acid Lvl: 42 ug/mL — ABNORMAL LOW (ref 50–100)

## 2024-08-18 LAB — SARS CORONAVIRUS 2 BY RT PCR: SARS Coronavirus 2 by RT PCR: NEGATIVE

## 2024-08-18 MED ORDER — ACETAMINOPHEN 325 MG PO TABS: 650.0000 mg | ORAL_TABLET | Freq: Four times a day (QID) | ORAL | Status: DC | PRN

## 2024-08-18 MED ORDER — MAGNESIUM HYDROXIDE 400 MG/5ML PO SUSP: 30.0000 mL | Freq: Every day | ORAL | Status: DC | PRN

## 2024-08-18 MED ORDER — RISPERIDONE 1 MG PO TBDP
0.5000 mg | ORAL_TABLET | Freq: Every day | ORAL | Status: DC
  Administered 2024-08-18 – 2024-08-21 (×4): 0.5 mg via ORAL
  Filled 2024-08-18 (×5): qty 0.5

## 2024-08-18 MED ORDER — DIVALPROEX SODIUM 250 MG PO DR TAB
500.0000 mg | DELAYED_RELEASE_TABLET | Freq: Every evening | ORAL | Status: DC
  Administered 2024-08-19 – 2024-08-25 (×7): 500 mg via ORAL
  Filled 2024-08-18 (×7): qty 2

## 2024-08-18 MED ORDER — LOSARTAN POTASSIUM 25 MG PO TABS
25.0000 mg | ORAL_TABLET | Freq: Every day | ORAL | Status: DC
  Administered 2024-08-19 – 2024-08-26 (×6): 25 mg via ORAL
  Filled 2024-08-18 (×8): qty 1

## 2024-08-18 MED ORDER — LEVOTHYROXINE SODIUM 75 MCG PO TABS
75.0000 ug | ORAL_TABLET | Freq: Every day | ORAL | Status: DC
  Administered 2024-08-19 – 2024-08-26 (×8): 75 ug via ORAL
  Filled 2024-08-18 (×8): qty 1

## 2024-08-18 MED ORDER — DIVALPROEX SODIUM 250 MG PO DR TAB
250.0000 mg | DELAYED_RELEASE_TABLET | Freq: Every morning | ORAL | Status: DC
Start: 2024-08-19 — End: 2024-08-26
  Administered 2024-08-19 – 2024-08-26 (×8): 250 mg via ORAL
  Filled 2024-08-18 (×8): qty 1

## 2024-08-18 MED ORDER — SIMVASTATIN 20 MG PO TABS
20.0000 mg | ORAL_TABLET | Freq: Every day | ORAL | Status: DC
  Administered 2024-08-18 – 2024-08-25 (×8): 20 mg via ORAL
  Filled 2024-08-18 (×8): qty 1

## 2024-08-18 MED ORDER — OLANZAPINE 5 MG PO TBDP: 5.0000 mg | ORAL_TABLET | Freq: Three times a day (TID) | ORAL | Status: DC | PRN

## 2024-08-18 MED ORDER — QUETIAPINE FUMARATE 200 MG PO TABS
200.0000 mg | ORAL_TABLET | Freq: Every day | ORAL | Status: DC
Start: 2024-08-18 — End: 2024-08-22
  Administered 2024-08-18 – 2024-08-21 (×4): 200 mg via ORAL
  Filled 2024-08-18 (×5): qty 1

## 2024-08-18 MED ORDER — ALUM & MAG HYDROXIDE-SIMETH 200-200-20 MG/5ML PO SUSP: 30.0000 mL | ORAL | Status: DC | PRN

## 2024-08-18 MED ORDER — OLANZAPINE 10 MG IM SOLR: 5.0000 mg | Freq: Three times a day (TID) | INTRAMUSCULAR | Status: DC | PRN

## 2024-08-18 NOTE — ED Notes (Signed)
 Pt with social services in interview room at this time.

## 2024-08-18 NOTE — ED Notes (Signed)
 Rn attempted to call report again at this time.

## 2024-08-18 NOTE — ED Notes (Signed)
 Pt received lunch tray

## 2024-08-18 NOTE — ED Notes (Signed)
 IVC pending placement

## 2024-08-18 NOTE — ED Notes (Signed)
 RN attempting to call report at this time.

## 2024-08-18 NOTE — ED Notes (Signed)
 Pt being discharged from ED for admission to [facility] at this time and is leaving with security and ED Tech escort with all belongings. Pt ABCs intact. RR even and unlabored. Pt in NAD. Pt denies further needs from this RN.

## 2024-08-18 NOTE — ED Provider Notes (Signed)
 Emergency Medicine Observation Re-evaluation Note  Pamela Benson is a 78 y.o. female, seen on rounds today.  Pt initially presented to the ED for complaints of Psychiatric Evaluation  Currently, the patient is is no acute distress. Denies any concerns at this time.  Physical Exam  Blood pressure (!) 135/51, pulse 64, temperature (!) 97.5 F (36.4 C), temperature source Oral, resp. rate 16, height 5' 4 (1.626 m), weight 111.1 kg, SpO2 99%.  Physical Exam: General: No apparent distress Pulm: Normal WOB Neuro: Moving all extremities Psych: Resting comfortably     ED Course / MDM     I have reviewed the labs performed to date as well as medications administered while in observation.  Recent changes in the last 24 hours include: No acute events overnight.  Plan   Current plan: Patient awaiting psychiatric disposition.  Patient under IVC and awaiting placement for inpatient psychiatric treatment.   Pamela Benson, Pamela SAILOR, DO 08/18/24 [id number]

## 2024-08-18 NOTE — ED Notes (Signed)
Pt was provided breakfast tray

## 2024-08-18 NOTE — Plan of Care (Signed)
 Patient new to unit, having time to progress.   Problem: Education: Goal: Ability to state activities that reduce stress will improve Outcome: Not Progressing   Problem: Coping: Goal: Ability to identify and develop effective coping behavior will improve Outcome: Not Progressing   Problem: Self-Concept: Goal: Ability to identify factors that promote anxiety will improve Outcome: Not Progressing Goal: Level of anxiety will decrease Outcome: Not Progressing Goal: Ability to modify response to factors that promote anxiety will improve Outcome: Not Progressing

## 2024-08-18 NOTE — ED Notes (Signed)
 IVC to geropsych after 730  update

## 2024-08-18 NOTE — ED Notes (Signed)
 Pt given dinner tray at this time and tea at this time.

## 2024-08-18 NOTE — Progress Notes (Addendum)
 Patient has been accepted to [facility] for 08/18/24. Patient was assigned to Room (TBD). Accepting physician is Dr. Jadapalle. Call report to [phone].  Representative was Linsey.     ER Staff is aware of it: Jon, ER Secretary Dr. Claudene, ER MD Leonor PEAK, Patient's Nurse

## 2024-08-18 NOTE — ED Notes (Signed)
 All documents received. Pt ready for transport to room 34.

## 2024-08-18 NOTE — Tx Team (Signed)
 Initial Treatment Plan 08/18/2024 10:49 PM Pamela Benson FMW:[id number]    PATIENT STRESSORS: Marital or family conflict   Traumatic event     PATIENT STRENGTHS: Ability for insight  Communication skills  Motivation for treatment/growth  Supportive family/friends    PATIENT IDENTIFIED PROBLEMS: Anxiety, and concern for leaving her home to an assistance living, since she stated she loves her home and is paid for                      DISCHARGE CRITERIA:  Improved stabilization in mood, thinking, and/or behavior Verbal commitment to aftercare and medication compliance  PRELIMINARY DISCHARGE PLAN: Outpatient therapy Return to previous living arrangement  PATIENT/FAMILY INVOLVEMENT: This treatment plan has been presented to and reviewed with the patient, [name]. The patient and family have been given the opportunity to ask questions and make suggestions.  Dorn Roe, RN 08/18/2024, 10:49 PM

## 2024-08-18 NOTE — ED Notes (Signed)
 IVC patient to [facility]

## 2024-08-18 NOTE — ED Notes (Addendum)
 Calisha with [facility] given report on pt at this time. Awiaitng IVC paperwork to be scanned to pt chart, then pt is cleared for transport.

## 2024-08-19 DIAGNOSIS — F22 Delusional disorders: Secondary | ICD-10-CM

## 2024-08-19 NOTE — H&P (Addendum)
 Psychiatric Admission Assessment Adult  Patient Identification: Pamela Benson MRN:  [id number] Date of Evaluation:  08/19/2024 Chief Complaint:  Delusions (HCC) [F22]   History of Present Illness: Patient was seen by this provider while in the emergency department and patient was admitted to the gero psych unit for the following On assessment today, patient is able to answer all my orientation questions correctly including her name, birthday, year, current president.  When asked what brought her in initially she reported she got really upset with a woman who had taken a friend of hers to her place.  Patient also reported her neighbors were packing up drugs in their basement.  Per collateral from sister, patient has been noted to be increasingly paranoid and accused family members of stealing her car, even though car is still in driveway.  She was seen recently by neurology, who made the above recommendations.  Patient was able to tell me that neurology mentioned she may be developing dementia.  She endorsed struggling with memory and reported neurologist told her something about veins not opening completely and brain.  Patient was able to tell me independently that she currently sees Dr. Lane, and that he recommended referral to geriatric psychiatrist, but they were unable to find her one. Patient was brought in IVC.   Patient reported she is usually compliant with medications, however she recently went on vacation with her friends to [place]  and she did not take her medications as scheduled.  On blood work, valproic acid is noted to be less than 10.  Patient reported she currently lives alone and cooks her own meals.  She reported having a sister named Pamela Benson, she gave permission for collateral information to be obtained from sister.  She denied any current suicidal or homicidal ideation.  She denied any access to firearms.  She did endorse screaming at her neighbor who she reported is  scared of me anyway so I think it worked.  She denied any visual hallucinations.  When asked about auditory hallucinations, patient stated I can hear the neighbors, it is two ladies packing up drugs in the basement.  She also reported hearing neighbor state I am going to get you, I am going to kill you dead.  She denied any previous inpatient psychiatric hospitalizations.   She reported her husband died in 11-19-24 of last year.  She is currently retired and receiving Tree surgeon.  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  She denied any recent alcohol  or illicit drug use.  She denied any tobacco use as well.  She denied any pending or previous legal charges.   Per TTS, Writer spoke to patient's sister, Pamela Benson. Pamela says the delusions have worsened in the past month although they had someone [facility]) to come out in which they said patient did not need their services. Pamela reports patient is able to care for herself and she does not have any concerns that she would try to harm herself or anyone else but patient is verbally aggressive. She states patient has accused her son and grandson of stealing her car and the car was in the drive way.   Today, on assessment, patient continues to deny suicidal or homicidal ideation. The delusion of her neighbors kidnapping her friend is still present, but patient stated I'm going to just stay away from them. We discussed that patient's depakote level was still subtherapeutic and we would like to see patient's level raise to therapeutic range, to which  patient verbalized understanding. We discussed potentially increasing patients nightly risperidone  as neurologist suggested, to which patient was agreeable if needed. Patient denied auditory or visual hallucinations. She reported sleeping well, but napping often during the day. She endorsed eating well.  Total Time spent with patient: 1 hour Sleep  Sleep:Sleep: Good  Past  Psychiatric History: Dementia  Psychiatric History:  Information collected from Patient/chart review  Prev Dx/Sx: Dementia Current Psych Provider: none- neurology referred patient for geriatric psychiatrist- unable to establish care at this time. Home Meds (current): Risperidone , seroquel , depakote Previous Med Trials: N/A Therapy: No  Prior Psych Hospitalization: No  Prior Self Harm: No Prior Violence: Reported yelling/screaming at neighbors  Family Psych History: No Family Hx suicide: No  Educational Hx: unknown Occupational Hx: Retired, receiving Pensions consultant Hx: Denied Living Situation: Lives alone Spiritual Hx: unknown Access to weapons/lethal means: Denied   Substance History Substance History Alcohol : Denied  Tobacco: Denied Illicit drugs: Denied Prescription drug abuse: Denied Rehab hx: Denied Is the patient at risk to self? No.  Has the patient been a risk to self in the past 6 months? No.  Has the patient been a risk to self within the distant past? No.  Is the patient a risk to others? Yes.    Has the patient been a risk to others in the past 6 months? Yes.    Has the patient been a risk to others within the distant past? No.   [place] Scale:  Flowsheet Row Admission (Current) from 08/18/2024 in [facility] ED from 08/15/2024 in [facility] Emergency Department at [facility] ED from 11/06/2023 in [facility] Emergency Department at [facility]  C-SSRS RISK CATEGORY No Risk No Risk No Risk     Past Medical History:  Past Medical History:  Diagnosis Date   Diabetes mellitus without complication (HCC)    Osteopenia after menopause    History reviewed. No pertinent surgical history. Family History: History reviewed. No pertinent family history.  Social History:  Social History   Substance and Sexual Activity  Alcohol  Use Not Currently     Social History   Substance and Sexual Activity  Drug Use Never      Allergies:   No Known Allergies Lab Results:  Results for orders placed or performed during the hospital encounter of 08/15/24 (from the past 48 hours)  SARS Coronavirus 2 by RT PCR (hospital order, performed in [facility] hospital lab) *cepheid single result test* Anterior Nasal Swab     Status: None   Collection Time: 08/18/24  8:12 AM   Specimen: Anterior Nasal Swab  Result Value Ref Range   SARS Coronavirus 2 by RT PCR NEGATIVE NEGATIVE    Comment: (NOTE) SARS-CoV-2 target nucleic acids are NOT DETECTED.  The SARS-CoV-2 RNA is generally detectable in upper and lower respiratory specimens during the acute phase of infection. The lowest concentration of SARS-CoV-2 viral copies this assay can detect is 250 copies / mL. A negative result does not preclude SARS-CoV-2 infection and should not be used as the sole basis for treatment or other patient management decisions.  A negative result may occur with improper specimen collection / handling, submission of specimen other than nasopharyngeal swab, presence of viral mutation(s) within the areas targeted by this assay, and inadequate number of viral copies (<250 copies / mL). A negative result must be combined with clinical observations, patient history, and epidemiological information.  Fact Sheet for Patients:   RoadLapTop.co.za  Fact Sheet for Healthcare  Providers: http://kim-miller.com/  This test is not yet approved or  cleared by the [place]  FDA and has been authorized for detection and/or diagnosis of SARS-CoV-2 by FDA under an Emergency Use Authorization (EUA).  This EUA will remain in effect (meaning this test can be used) for the duration of the COVID-19 declaration under Section 564(b)(1) of the Act, 21 U.S.C. section 360bbb-3(b)(1), unless the authorization is terminated or revoked sooner.  Performed at [facility], [address], [place], [place] [address]   Valproic  acid level     Status: Abnormal   Collection Time: 08/18/24  9:19 AM  Result Value Ref Range   Valproic Acid Lvl 42 (L) 50 - 100 ug/mL    Comment: Performed at [facility], [address] [place], [place] [address]    Blood Alcohol  level:  Lab Results  Component Value Date   [facility] <15 08/15/2024   ETH <10 11/06/2023    Metabolic Disorder Labs:  Lab Results  Component Value Date   HGBA1C 6.8 (H) 05/26/2023   MPG 148 05/26/2023   No results found for: PROLACTIN Lab Results  Component Value Date   CHOL 187 05/26/2023   TRIG 140 05/26/2023   HDL 40 (L) 05/26/2023   CHOLHDL 4.7 05/26/2023   VLDL 28 05/26/2023   LDLCALC 119 (H) 05/26/2023    Current Medications: Current Facility-Administered Medications  Medication Dose Route Frequency Provider Last Rate Last Admin   acetaminophen  (TYLENOL ) tablet 650 mg  650 mg Oral Q6H PRN Julez Huseby B, NP       alum & mag hydroxide-simeth (MAALOX/MYLANTA) 200-200-20 MG/5ML suspension 30 mL  30 mL Oral Q4H PRN Brinna Divelbiss B, NP       divalproex (DEPAKOTE) DR tablet 250 mg  250 mg Oral q AM Brookes Craine B, NP   250 mg at 08/19/24 0634   divalproex (DEPAKOTE) DR tablet 500 mg  500 mg Oral QPM Johathan Province B, NP       levothyroxine  (SYNTHROID ) tablet 75 mcg  75 mcg Oral Q0600 Nandana Krolikowski B, NP   75 mcg at 08/19/24 0633   losartan (COZAAR) tablet 25 mg  25 mg Oral Daily Treazure Nery B, NP   25 mg at 08/19/24 1005   magnesium  hydroxide (MILK OF MAGNESIA) suspension 30 mL  30 mL Oral Daily PRN Aava Deland B, NP       OLANZapine  (ZYPREXA ) injection 5 mg  5 mg Intramuscular TID PRN Jaquawn Saffran B, NP       OLANZapine  zydis (ZYPREXA ) disintegrating tablet 5 mg  5 mg Oral TID PRN Hana Trippett B, NP       QUEtiapine  (SEROQUEL ) tablet 200 mg  200 mg Oral QHS Grady Mohabir B, NP   200 mg at 08/18/24 2115   risperiDONE  (RISPERDAL  M-TABS) disintegrating tablet 0.5 mg  0.5 mg Oral QHS Jalissa Heinzelman B, NP   0.5 mg at 08/18/24 2115   simvastatin  (ZOCOR) tablet 20 mg  20 mg Oral QHS Wood Novacek B, NP   20 mg at 08/18/24 2115   PTA Medications: Medications Prior to Admission  Medication Sig Dispense Refill Last Dose/Taking   alendronate  (FOSAMAX ) 70 MG tablet Take 70 mg by mouth once a week. (Patient not taking: Reported on 08/16/2024)      diazepam (VALIUM) 5 MG tablet Take 5 mg by mouth every 8 (eight) hours as needed. (Patient not taking: Reported on 08/16/2024)      divalproex (DEPAKOTE) 125 MG DR  tablet Take 125 mg twice a day      divalproex (DEPAKOTE) 250 MG DR tablet Take 250-500 mg by mouth.  Take 250 mg by mouth in the morning and 250 mg in the evening. 250 mg in the morning and 500 mg at night.      estradiol  (ESTRACE ) 0.1 MG/GM vaginal cream Estrogen Cream Instruction Discard applicator Apply pea sized amount to tip of finger to urethra before bed. Wash hands well after application. Use Monday, Wednesday and Friday (Patient not taking: Reported on 08/16/2024) 42.5 g 12    furosemide (LASIX) 20 MG tablet Take 20 mg by mouth daily.      levothyroxine  (SYNTHROID ) 75 MCG tablet Take 1 tablet (75 mcg total) by mouth daily at 6 (six) AM. 30 tablet 11    losartan (COZAAR) 25 MG tablet Take 25 mg by mouth daily.      polyvinyl alcohol  (LIQUIFILM TEARS) 1.4 % ophthalmic solution Place 1 drop into both eyes as needed for dry eyes. 15 mL 0    QUEtiapine  (SEROQUEL ) 200 MG tablet Take 200 mg by mouth at bedtime.      QUEtiapine  (SEROQUEL ) 400 MG tablet Take 400 mg by mouth at bedtime.      risperiDONE  (RISPERDAL ) 0.5 MG tablet Take 0.25 mg by mouth at bedtime.      simvastatin (ZOCOR) 20 MG tablet Take 20 mg by mouth at bedtime.       Psychiatric Specialty Exam:  Presentation  General Appearance:  Appropriate for Environment  Eye Contact: Good  Speech: Clear and Coherent  Speech Volume: Normal    Mood and Affect  Mood: Euthymic  Affect: Appropriate   Thought Process  Thought Processes: Coherent;  Linear  Descriptions of Associations:Intact  Orientation:Full (Time, Place and Person)  Thought Content:Delusions  Hallucinations:No data recorded Ideas of Reference:Delusions  Suicidal Thoughts:Suicidal Thoughts: No  Homicidal Thoughts:Homicidal Thoughts: No   Sensorium  Memory: Immediate Fair; Recent Fair; Remote Fair  Judgment: Fair  Insight: Lacking   Executive Functions  Concentration: Good  Attention Span: Good  Recall: Fair  Fund of Knowledge: Fair  Language: Good   Psychomotor Activity  Psychomotor Activity: Psychomotor Activity: Normal   Assets  Assets: Communication Skills; Social Support; Housing    Musculoskeletal: Strength & Muscle Tone: within normal limits Gait & Station: normal  Physical Exam: Physical Exam Pulmonary:     Effort: Pulmonary effort is normal.  Neurological:     Mental Status: She is alert and oriented to person, place, and time.    Review of Systems  Psychiatric/Behavioral:  Positive for memory loss. Negative for depression and suicidal ideas.    Blood pressure 120/62, pulse 75, temperature 97.9 F (36.6 C), resp. rate 17, height 5' 4 (1.626 m), weight 99.8 kg, SpO2 98%. Body mass index is 37.76 kg/m.  Principal Diagnosis: Delusions (HCC) Diagnosis:  Principal Problem:   Delusions [facility])   Clinical Decision Making:  Treatment Plan Summary:  Safety and Monitoring:             -- Voluntary admission to inpatient psychiatric unit for safety, stabilization and treatment             -- Daily contact with patient to assess and evaluate symptoms and progress in treatment             -- Patient's case to be discussed in multi-disciplinary team meeting             -- Observation Level: q15 minute checks             --  Vital signs:  q12 hours             -- Precautions: suicide, elopement, and assault   2. Psychiatric Diagnoses and Treatment:                - Increase Depakote to 250 mg in the morning,  and 500 mg at night, per neurology recommendations -Continue home Seroquel  200 mg at night -Continue home Risperdal  0.5 mg nightly- discussed potentially increasing to 1mg  nightly to help with delusions and irritability   -- The risks/benefits/side-effects/alternatives to this medication were discussed in detail with the patient and time was given for questions. The patient consents to medication trial.                -- Metabolic profile and EKG monitoring obtained while on an atypical antipsychotic (BMI: Lipid Panel: HbgA1c: QTc:)              -- Encouraged patient to participate in unit milieu and in scheduled group therapies                            3. Medical Issues Being Addressed:   -Continue patient's synthroid - 75mcg daily -Continue patient's home simvastatin 20 mg daily -Continue patient's losartan 25 mg daily   4. Discharge Planning:              -- Social work and case management to assist with discharge planning and identification of hospital follow-up needs prior to discharge             -- Estimated LOS: 5-7 days             -- Discharge Concerns: Need to establish a safety plan; Medication compliance and effectiveness             -- Discharge Goals: Return home with outpatient referrals follow ups  Physician Treatment Plan for Primary Diagnosis: Delusions (HCC) Long Term Goal(s): Improvement in symptoms so as ready for discharge  Short Term Goals: Ability to identify changes in lifestyle to reduce recurrence of condition will improve, Ability to verbalize feelings will improve, Ability to identify and develop effective coping behaviors will improve, and Ability to maintain clinical measurements within normal limits will improve  Physician Treatment Plan for Secondary Diagnosis: Principal Problem:   Delusions (HCC)  Long Term Goal(s): Improvement in symptoms so as ready for discharge  Short Term Goals: Ability to identify changes in lifestyle to reduce recurrence of  condition will improve, Ability to verbalize feelings will improve, Ability to identify and develop effective coping behaviors will improve, and Ability to maintain clinical measurements within normal limits will improve  I certify that inpatient services furnished can reasonably be expected to improve the patient's condition.    Zelda Sharps, NP

## 2024-08-19 NOTE — BH IP Treatment Plan (Signed)
 Interdisciplinary Treatment and Diagnostic Plan Update  08/19/2024 Time of Session: 10:24 AM  Pamela Benson MRN: [id number]  Principal Diagnosis: Delusions [facility])  Secondary Diagnoses: Principal Problem:   Delusions (HCC)   Current Medications:  Current Facility-Administered Medications  Medication Dose Route Frequency Provider Last Rate Last Admin   acetaminophen  (TYLENOL ) tablet 650 mg  650 mg Oral Q6H PRN Smith, Annie B, NP       alum & mag hydroxide-simeth (MAALOX/MYLANTA) 200-200-20 MG/5ML suspension 30 mL  30 mL Oral Q4H PRN Smith, Annie B, NP       divalproex (DEPAKOTE) DR tablet 250 mg  250 mg Oral q AM Smith, Annie B, NP   250 mg at 08/19/24 0634   divalproex (DEPAKOTE) DR tablet 500 mg  500 mg Oral QPM Smith, Annie B, NP       levothyroxine  (SYNTHROID ) tablet 75 mcg  75 mcg Oral Q0600 Smith, Annie B, NP   75 mcg at 08/19/24 0633   losartan (COZAAR) tablet 25 mg  25 mg Oral Daily Smith, Annie B, NP   25 mg at 08/19/24 1005   magnesium  hydroxide (MILK OF MAGNESIA) suspension 30 mL  30 mL Oral Daily PRN Smith, Annie B, NP       OLANZapine  (ZYPREXA ) injection 5 mg  5 mg Intramuscular TID PRN Smith, Annie B, NP       OLANZapine  zydis (ZYPREXA ) disintegrating tablet 5 mg  5 mg Oral TID PRN Smith, Annie B, NP       QUEtiapine  (SEROQUEL ) tablet 200 mg  200 mg Oral QHS Smith, Annie B, NP   200 mg at 08/18/24 2115   risperiDONE  (RISPERDAL  M-TABS) disintegrating tablet 0.5 mg  0.5 mg Oral QHS Smith, Annie B, NP   0.5 mg at 08/18/24 2115   simvastatin (ZOCOR) tablet 20 mg  20 mg Oral QHS Smith, Annie B, NP   20 mg at 08/18/24 2115   PTA Medications: Medications Prior to Admission  Medication Sig Dispense Refill Last Dose/Taking   alendronate  (FOSAMAX ) 70 MG tablet Take 70 mg by mouth once a week. (Patient not taking: Reported on 08/16/2024)      diazepam (VALIUM) 5 MG tablet Take 5 mg by mouth every 8 (eight) hours as needed. (Patient not taking: Reported on 08/16/2024)      divalproex  (DEPAKOTE) 125 MG DR tablet Take 125 mg twice a day      divalproex (DEPAKOTE) 250 MG DR tablet Take 250-500 mg by mouth.  Take 250 mg by mouth in the morning and 250 mg in the evening. 250 mg in the morning and 500 mg at night.      estradiol  (ESTRACE ) 0.1 MG/GM vaginal cream Estrogen Cream Instruction Discard applicator Apply pea sized amount to tip of finger to urethra before bed. Wash hands well after application. Use Monday, Wednesday and Friday (Patient not taking: Reported on 08/16/2024) 42.5 g 12    furosemide (LASIX) 20 MG tablet Take 20 mg by mouth daily.      levothyroxine  (SYNTHROID ) 75 MCG tablet Take 1 tablet (75 mcg total) by mouth daily at 6 (six) AM. 30 tablet 11    losartan (COZAAR) 25 MG tablet Take 25 mg by mouth daily.      polyvinyl alcohol  (LIQUIFILM TEARS) 1.4 % ophthalmic solution Place 1 drop into both eyes as needed for dry eyes. 15 mL 0    QUEtiapine  (SEROQUEL ) 200 MG tablet Take 200 mg by mouth at bedtime.      QUEtiapine  (  SEROQUEL ) 400 MG tablet Take 400 mg by mouth at bedtime.      risperiDONE  (RISPERDAL ) 0.5 MG tablet Take 0.25 mg by mouth at bedtime.      simvastatin (ZOCOR) 20 MG tablet Take 20 mg by mouth at bedtime.       Patient Stressors: Marital or family conflict   Traumatic event    Patient Strengths: Ability for insight  Manufacturing systems engineer  Motivation for treatment/growth  Supportive family/friends   Treatment Modalities: Medication Management, Group therapy, Case management,  1 to 1 session with clinician, Psychoeducation, Recreational therapy.   Physician Treatment Plan for Primary Diagnosis: Delusions (HCC) Long Term Goal(s):     Short Term Goals:    Medication Management: Evaluate patient's response, side effects, and tolerance of medication regimen.  Therapeutic Interventions: 1 to 1 sessions, Unit Group sessions and Medication administration.  Evaluation of Outcomes: Not Progressing  Physician Treatment Plan for Secondary Diagnosis:  Principal Problem:   Delusions (HCC)  Long Term Goal(s):     Short Term Goals:       Medication Management: Evaluate patient's response, side effects, and tolerance of medication regimen.  Therapeutic Interventions: 1 to 1 sessions, Unit Group sessions and Medication administration.  Evaluation of Outcomes: Not Progressing   RN Treatment Plan for Primary Diagnosis: Delusions (HCC) Long Term Goal(s): Knowledge of disease and therapeutic regimen to maintain health will improve  Short Term Goals: Ability to remain free from injury will improve, Ability to verbalize frustration and anger appropriately will improve, Ability to demonstrate self-control, Ability to participate in decision making will improve, Ability to verbalize feelings will improve, Ability to disclose and discuss suicidal ideas, Ability to identify and develop effective coping behaviors will improve, and Compliance with prescribed medications will improve  Medication Management: RN will administer medications as ordered by provider, will assess and evaluate patient's response and provide education to patient for prescribed medication. RN will report any adverse and/or side effects to prescribing provider.  Therapeutic Interventions: 1 on 1 counseling sessions, Psychoeducation, Medication administration, Evaluate responses to treatment, Monitor vital signs and CBGs as ordered, Perform/monitor CIWA, COWS, AIMS and Fall Risk screenings as ordered, Perform wound care treatments as ordered.  Evaluation of Outcomes: Not Progressing   LCSW Treatment Plan for Primary Diagnosis: Delusions [facility]) Long Term Goal(s): Safe transition to appropriate next level of care at discharge, Engage patient in therapeutic group addressing interpersonal concerns.  Short Term Goals: Engage patient in aftercare planning with referrals and resources, Increase social support, Increase ability to appropriately verbalize feelings, Increase emotional  regulation, Facilitate acceptance of mental health diagnosis and concerns, Facilitate patient progression through stages of change regarding substance use diagnoses and concerns, Identify triggers associated with mental health/substance abuse issues, and Increase skills for wellness and recovery  Therapeutic Interventions: Assess for all discharge needs, 1 to 1 time with Social worker, Explore available resources and support systems, Assess for adequacy in community support network, Educate family and significant other(s) on suicide prevention, Complete Psychosocial Assessment, Interpersonal group therapy.  Evaluation of Outcomes: Not Progressing   Progress in Treatment: Attending groups: No. Participating in groups: No. Taking medication as prescribed: Yes. Toleration medication: Yes. Family/Significant other contact made: No, will contact:  CSW will contact if given permission  Patient understands diagnosis: No. Discussing patient identified problems/goals with staff: Yes. Medical problems stabilized or resolved: Yes. Denies suicidal/homicidal ideation: Yes. Issues/concerns per patient self-inventory: No. Other: None   New problem(s) identified: No, Describe:  None identified   New Short  Term/Long Term Goal(s):  elimination of symptoms of psychosis, medication management for mood stabilization; elimination of SI thoughts; development of comprehensive mental wellness plan.   Patient Goals:   I want to leave here , go back to my home   Discharge Plan or Barriers: CSW will assist with appropriate discharge planning   Reason for Continuation of Hospitalization: Delusions  Hallucinations Medication stabilization  Estimated Length of Stay: 1 to 7 days  Last 3 [place] Suicide Severity Risk Score: Flowsheet Row Admission (Current) from 08/18/2024 in [facility] ED from 08/15/2024 in [facility] Department at [facility] ED from 11/06/2023 in [facility] Emergency Department at [facility]  C-SSRS RISK CATEGORY No Risk No Risk No Risk    Last PHQ 2/9 Scores:     No data to display          Scribe for Treatment Team: Lum JONETTA Croft, LCSWA 08/19/2024 11:48 AM

## 2024-08-19 NOTE — BHH Counselor (Signed)
 Adult Comprehensive Assessment  Patient ID: Pamela Benson, female   DOB: 22-May-1946, 78 y.o.   MRN: [id number]  Information Source: Information source: Patient  Current Stressors:  Patient states their primary concerns and needs for treatment are:: I had some kind of attitude I reckon with my help, and the lady had taken a friend of mine and I was mad, but it all worked out in the end Patient states their goals for this hospitilization and ongoing recovery are:: I just want to be able to go back home and live in Contractor / Learning stressors: None reported Employment / Job issues: Pt reports she is retired and has been retired for 6 or 7 years Family Relationships: not really Surveyor, quantity / Lack of resources (include bankruptcy): not really, I'm making it on my own and have Social [employer] / Lack of housing: I've owned my house since 09/03/2002 Physical health (include injuries & life threatening diseases): my knees are giving away, sometimes when I walk Social relationships: no that I'm aware of Substance abuse: none reported Bereavement / Loss: not that I'm aware of  Living/Environment/Situation:  Living Arrangements: Alone Living conditions (as described by patient or guardian): it's all one level, built on a slab, sort of an L-shaped house Who else lives in the home?: Pt reports she lives a lone How long has patient lived in current situation?: Pt reports she has been living there since 2002/09/03  Family History:  Marital status: Widowed Widowed, when?: Pt reports he passed away in 09/03/01 Are you sexually active?: No What is your sexual orientation?: I like men Has your sexual activity been affected by drugs, alcohol , medication, or emotional stress?: N/A Does patient have children?: No  Childhood History:  By whom was/is the patient raised?: Mother, Father Additional childhood history information: None reported Description of patient's relationship with  caregiver when they were a child: Pt reports that her father passed away when she was a teenager. Reports her relationship with her parents was all right Patient's description of current relationship with people who raised him/her: Pt reports they are deceased How were you disciplined when you got in trouble as a child/adolescent?: She would send us  out to get our own switch if we had done something Does patient have siblings?: Yes Number of Siblings: 3 Description of patient's current relationship with siblings: Pt reports she has 1 living sister and a deceased brother and sister Did patient suffer any verbal/emotional/physical/sexual abuse as a child?: No Did patient suffer from severe childhood neglect?: No Has patient ever been sexually abused/assaulted/raped as an adolescent or adult?: No Was the patient ever a victim of a crime or a disaster?: No Witnessed domestic violence?: No Has patient been affected by domestic violence as an adult?: No  Education:  Highest grade of school patient has completed: 12th grade Currently a student?: No Learning disability?: No  Employment/Work Situation:   Employment Situation: Retired Passenger transport manager has Been Impacted by Current Illness: No What is the Longest Time Patient has Held a Job?: I got laid off after 16 years Where was the Patient Employed at that Time?:  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 Has Patient ever Been in Equities trader?: No  Financial Resources:   Surveyor, quantity resources: [employer], Medicare  Alcohol /Substance Abuse:   What has been your use of drugs/alcohol  within the last 12 months?: None reported If attempted suicide, did drugs/alcohol  play a role in this?: No Alcohol /Substance Abuse Treatment Hx: Denies past history If  yes, describe treatment: N/A Has alcohol /substance abuse ever caused legal problems?: No  Social Support System:   Forensic psychologist System: None Describe Community Support System:  I'm not really sure about that I have a lot of friends Type of faith/religion: No, not really How does patient's faith help to cope with current illness?: N/A  Leisure/Recreation:   Do You Have Hobbies?: Yes Leisure and Hobbies: I love going to music shows  Strengths/Needs:   What is the patient's perception of their strengths?: I used to be good at cooking, and I still cook a little bit but not like I used to Patient states they can use these personal strengths during their treatment to contribute to their recovery: Pt does not report Patient states these barriers may affect/interfere with their treatment: Not that I know of Patient states these barriers may affect their return to the community: No Other important information patient would like considered in planning for their treatment: I'd like to be back home  Discharge Plan:   Currently receiving community mental health services: No Patient states concerns and preferences for aftercare planning are: Pt reports she needs a geriatric psychiatrist Patient states they will know when they are safe and ready for discharge when:  I think I'm ready now, but I hope so Does patient have access to transportation?: No Patient description of barriers related to discharge medications: None Plan for no access to transportation at discharge: Pt would have a family member to pick her up, her sister Devere Will patient be returning to same living situation after discharge?: Yes  Summary/Recommendations:   Summary and Recommendations (to be completed by the evaluator): Patient is a 78 year-old female from [place], [place] [facility]). Per EDP note, ALBIRTA RHINEHART is a 78 y.o. female with a history of dementia is brought to the [facility] for involuntary commitment due to wandering in her neighborhood , agitation, reporting that she is looking for somebody  Upon assessment today, pt reports that she had a friend staying with her, and he had  gotten sick and he was kidnapped by her neighbor. Pt reports she screamed and had a meltdown and her other neighbors tried to help her. Pt reports she would like to discharge back home. Pt's primary diagnosis is Delusions. Recommendations include: crisis stabilization, therapeutic milieu, encourage group attendance and participation, medication management for mood stabilization and development of comprehensive mental wellness/sobriety plan.  Lum JONETTA Croft. 08/19/2024

## 2024-08-19 NOTE — BHH Suicide Risk Assessment (Signed)
 BHH INPATIENT:  Family/Significant Other Suicide Prevention Education  Suicide Prevention Education:  Patient Refusal for Family/Significant Other Suicide Prevention Education: The patient Pamela Benson has refused to provide written consent for family/significant other to be provided Family/Significant Other Suicide Prevention Education during admission and/or prior to discharge.  Physician notified.  Lum JONETTA Croft 08/19/2024, 1:55 PM

## 2024-08-19 NOTE — Progress Notes (Addendum)
 Pamela Benson, a 78 year old female with a history of dementia and anxiety, was admitted to the [facility] unit for involuntary commitment after presenting with anxiety and wandering in her neighborhood. She was brought to the unit by hospital security and a technician, with normal vitals upon admission, except for elevated blood pressure, which she stated is her baseline.   Though she experiences general weakness and knee pain with prolonged walking, she remains independent and oriented x4. Calm, cooperative, and pleasant, Ms. Key expressed feelings of anxiety and depression, which were consistent with her flat affect. She acknowledged her dementia diagnosis and voiced concerns about neighbors' complaints, her fear of losing her driver's license, and a strong desire to stay in her home, which is fully paid for. She has no social support aside from an occasionally check-in from her older sister.  Her goal is to get better and go back to living her normal life. She is medication compliant, showered and went to sleep without issues, and safety checks were completed with no concerns. Patients belongings were checked by [place], NT and is in patients locker.  Her care will continue to focus on monitoring her mental and physical health, particularly her anxiety and wandering tendencies.   08/19/24 0050  Psych Admission Type (Psych Patients Only)  Admission Status Involuntary  Psychosocial Assessment  Patient Complaints Anxiety;Depression;Other (Comment)  Eye Contact Fair  Facial Expression Animated  Affect Flat;Fearful  Speech Soft  Interaction Other (Comment)  Motor Activity Slow  Appearance/Hygiene In scrubs  Behavior Characteristics Cooperative;Calm  Mood Anxious;Depressed  Thought Process  Coherency WDL  Content WDL  Delusions None reported or observed  Perception WDL  Hallucination None reported or observed  Judgment Impaired  Confusion Mild  Danger to Self  Current suicidal  ideation? Denies  Agreement Not to Harm Self Yes  Description of Agreement Verbal  Danger to Others  Danger to Others None reported or observed

## 2024-08-19 NOTE — Plan of Care (Signed)
  Problem: Coping: Goal: Ability to identify and develop effective coping behavior will improve Outcome: Progressing   Problem: Self-Concept: Goal: Ability to identify factors that promote anxiety will improve Outcome: Progressing Goal: Level of anxiety will decrease Outcome: Progressing   

## 2024-08-19 NOTE — Progress Notes (Signed)
 Mood/Behavior:  Cooperative and pleasant.     Psych assessment:  Denies SI/HI and AVH.  No complaints.   Interaction / Group attendance:  Present in the milieu.  Appropriate interaction with peers and staff.    Medication/ PRNs:  Compliant with scheduled medication. No PRNs needed.  Pain:  Denies  15 min checks in place for safety.

## 2024-08-19 NOTE — [facility] Suicide Risk Assessment (Cosign Needed)
 [facility] Admission Suicide Risk Assessment   Nursing information obtained from:  Patient Demographic factors:  Age 78 or older, Living alone Current Mental Status:  NA Loss Factors:  Loss of significant relationship, Decline in physical health Historical Factors:  NA Risk Reduction Factors:  Positive social support, Positive coping skills or problem solving skills  Total Time spent with patient: 1 hour Principal Problem: Delusions (HCC) Diagnosis:  Principal Problem:   Delusions (HCC)  Subjective Data: Patient was seen by this provider while in the emergency department and patient was admitted to the [facility] psych unit for the following On assessment today, patient is able to answer all my orientation questions correctly including her name, birthday, year, current president.  When asked what brought her in initially she reported she got really upset with a woman who had taken a friend of hers to her place.  Patient also reported her neighbors were packing up drugs in their basement.  Per collateral from sister, patient has been noted to be increasingly paranoid and accused family members of stealing her car, even though car is still in driveway.  She was seen recently by neurology, who made the above recommendations.  Patient was able to tell me that neurology mentioned she may be developing dementia.  She endorsed struggling with memory and reported neurologist told her something about veins not opening completely and brain.  Patient was able to tell me independently that she currently sees Dr. Lane, and that he recommended referral to geriatric psychiatrist, but they were unable to find her one. Patient was brought in IVC.   Patient reported she is usually compliant with medications, however she recently went on vacation with her friends to [place]  and she did not take her medications as scheduled.  On blood work, valproic acid is noted to be less than 10.  Patient reported she currently lives alone  and cooks her own meals.  She reported having a sister named [name], she gave permission for collateral information to be obtained from sister.  She denied any current suicidal or homicidal ideation.  She denied any access to firearms.  She did endorse screaming at her neighbor who she reported is scared of me anyway so I think it worked.  She denied any visual hallucinations.  When asked about auditory hallucinations, patient stated I can hear the neighbors, it is two ladies packing up drugs in the basement.  She also reported hearing neighbor state I am going to get you, I am going to kill you dead.  She denied any previous inpatient psychiatric hospitalizations.   She reported her husband died in 11-05-24 of last year.  She is currently retired and receiving Tree surgeon.  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  She denied any recent alcohol  or illicit drug use.  She denied any tobacco use as well.  She denied any pending or previous legal charges.   Per TTS, Writer spoke to patient's sister, [name]. Devere says the delusions have worsened in the past month although they had someone [facility]) to come out in which they said patient did not need their services. Devere reports patient is able to care for herself and she does not have any concerns that she would try to harm herself or anyone else but patient is verbally aggressive. She states patient has accused her son and grandson of stealing her car and the car was in the drive way.    Today, on assessment, patient continues to  deny suicidal or homicidal ideation. The delusion of her neighbors kidnapping her friend is still present, but patient stated I'm going to just stay away from them. We discussed that patient's depakote level was still subtherapeutic and we would like to see patient's level raise to therapeutic range, to which patient verbalized understanding. We discussed potentially increasing  patients nightly risperidone  as neurologist suggested, to which patient was agreeable if needed. Patient denied auditory or visual hallucinations. She reported sleeping well, but napping often during the day. She endorsed eating well.  Continued Clinical Symptoms:  Alcohol  Use Disorder Identification Test Final Score (AUDIT): 0 The Alcohol  Use Disorders Identification Test, Guidelines for Use in Primary Care, Second Edition.  World Science writer [facility]). Score between 0-7:  no or low risk or alcohol  related problems. Score between 8-15:  moderate risk of alcohol  related problems. Score between 16-19:  high risk of alcohol  related problems. Score 20 or above:  warrants further diagnostic evaluation for alcohol  dependence and treatment.   CLINICAL FACTORS:   Dementia   Musculoskeletal: Strength & Muscle Tone: within normal limits Gait & Station: normal Patient leans: N/A  Psychiatric Specialty Exam:  Presentation  General Appearance:  Appropriate for Environment  Eye Contact: Good  Speech: Clear and Coherent  Speech Volume: Normal  Handedness: Right   Mood and Affect  Mood: Euthymic  Affect: Appropriate   Thought Process  Thought Processes: Coherent; Linear  Descriptions of Associations:Intact  Orientation:Full (Time, Place and Person)  Thought Content:Delusions  History of Schizophrenia/Schizoaffective disorder:No  Duration of Psychotic Symptoms:Greater than six months  Hallucinations:No data recorded Ideas of Reference:Delusions  Suicidal Thoughts:Suicidal Thoughts: No  Homicidal Thoughts:Homicidal Thoughts: No   Sensorium  Memory: Immediate Fair; Recent Fair; Remote Fair  Judgment: Fair  Insight: Lacking   Executive Functions  Concentration: Good  Attention Span: Good  Recall: Fair  Fund of Knowledge: Fair  Language: Good   Psychomotor Activity  Psychomotor Activity: Psychomotor Activity: Normal   Assets   Assets: Communication Skills; Social Support; Housing   Sleep  Sleep: Sleep: Good    Physical Exam: Physical Exam Pulmonary:     Effort: Pulmonary effort is normal.  Neurological:     Mental Status: She is alert and oriented to person, place, and time.    Review of Systems  Psychiatric/Behavioral:  Positive for memory loss. Negative for depression and suicidal ideas.    Blood pressure 120/62, pulse 75, temperature 97.9 F (36.6 C), resp. rate 17, height 5' 4 (1.626 m), weight 99.8 kg, SpO2 98%. Body mass index is 37.76 kg/m.   COGNITIVE FEATURES THAT CONTRIBUTE TO RISK:  Memory impairment    SUICIDE RISK:   Minimal: No identifiable suicidal ideation.  Patients presenting with no risk factors but with morbid ruminations; may be classified as minimal risk based on the severity of the depressive symptoms  PLAN OF CARE: Admit patient to inpatient services for further stabilization and management of symptoms- goal to get depakote level back to therapeutic range and possibly titrate risperidone  while on the unit.  I certify that inpatient services furnished can reasonably be expected to improve the patient's condition.   Zelda Sharps, NP

## 2024-08-19 NOTE — Plan of Care (Deleted)
  Problem: Education: Goal: Ability to state activities that reduce stress will improve 08/19/2024 0657 by Rudolpho Carrier, RN Outcome: Progressing 08/18/2024 2246 by Rudolpho Carrier, RN Outcome: Not Progressing   Problem: Coping: Goal: Ability to identify and develop effective coping behavior will improve 08/19/2024 0657 by Rudolpho Carrier, RN Outcome: Progressing 08/18/2024 2246 by Rudolpho Carrier, RN Outcome: Not Progressing   Problem: Self-Concept: Goal: Ability to identify factors that promote anxiety will improve 08/19/2024 0657 by Rudolpho Carrier, RN Outcome: Progressing 08/18/2024 2246 by Rudolpho Carrier, RN Outcome: Not Progressing Goal: Level of anxiety will decrease 08/19/2024 0657 by Rudolpho Carrier, RN Outcome: Progressing 08/18/2024 2246 by Rudolpho Carrier, RN Outcome: Not Progressing Goal: Ability to modify response to factors that promote anxiety will improve 08/19/2024 0657 by Rudolpho Carrier, RN Outcome: Progressing 08/18/2024 2246 by Rudolpho Carrier, RN Outcome: Not Progressing

## 2024-08-19 NOTE — Group Note (Signed)
 Recreation Therapy Group Note   Group Topic:Coping Skills  Group Date: 08/19/2024 Start Time: 1500 End Time: 1600 Facilitators: Celestia Jeoffrey BRAVO, LRT, CTRS Location: Courtyard  Group Description: Music. Patients encouraged to name their favorite song(s) for LRT to play song through speaker for group to hear, while in the courtyard getting fresh air and sunlight. Patients educated on the definition of leisure and the importance of having different leisure interests outside of the hospital. Group discussed how leisure activities can often be used as Pharmacologist and that listening to music and being outside are examples.    Goal Area(s) Addressed:  Patient will identify a current leisure interest.  Patient will practice making a positive decision. Patient will have the opportunity to try a new leisure activity.   Affect/Mood: N/A   Participation Level: Did not attend    Clinical Observations/Individualized Feedback: Patient did not attend group.   Plan: Continue to engage patient in RT group sessions 2-3x/week.   Jeoffrey BRAVO Celestia, LRT, CTRS 08/19/2024 5:19 PM

## 2024-08-19 NOTE — Group Note (Signed)
 Date:  08/19/2024 Time:  11:43 PM  Group Topic/Focus:  Wrap-Up Group:   The focus of this group is to help patients review their daily goal of treatment and discuss progress on daily workbooks.    Participation Level:  Active  Participation Quality:  Appropriate  Affect:  Appropriate  Cognitive:  Alert  Insight: Appropriate  Engagement in Group:  Engaged  Modes of Intervention:  Discussion  Additional Comments:    Pamela Benson Bunker 08/19/2024, 11:43 PM

## 2024-08-20 DIAGNOSIS — F0630 Mood disorder due to known physiological condition, unspecified: Secondary | ICD-10-CM

## 2024-08-20 LAB — LIPID PANEL
Cholesterol: 184 mg/dL (ref 0–200)
HDL: 45 mg/dL (ref >40)
LDL Cholesterol: 124 mg/dL — ABNORMAL HIGH (ref 0–99)
Total CHOL/HDL Ratio: 4.1 ratio
Triglycerides: 75 mg/dL (ref <150)
VLDL: 15 mg/dL (ref 0–40)

## 2024-08-20 LAB — TSH: TSH: 2.614 u[IU]/mL (ref 0.350–4.500)

## 2024-08-20 LAB — HEMOGLOBIN A1C
Hgb A1c MFr Bld: 5.9 % — ABNORMAL HIGH (ref 4.8–5.6)
Mean Plasma Glucose: 122.63 mg/dL

## 2024-08-20 NOTE — Group Note (Signed)
 Date:  08/20/2024 Time:  11:14 AM  Group Topic/Focus:  Emotional Education:   The focus of this group is to discuss what feelings/emotions are, and how they are experienced.    Participation Level:  Did Not Attend   Pamela Benson 08/20/2024, 11:14 AM

## 2024-08-20 NOTE — Plan of Care (Signed)
  Problem: Activity: Goal: Interest or engagement in activities will improve Outcome: Progressing   Problem: Safety: Goal: Periods of time without injury will increase Outcome: Progressing

## 2024-08-20 NOTE — Progress Notes (Signed)
 [facility] MD Progress Note Date/Time: 08/20/2024 10:30 AM     Attending Psychiatrist: Millie Manners, MD Unit: Geriatric Psychiatry  Interval History / Subjective:  Patient was seen and evaluated today on rounds. She appears calm, cooperative, and less paranoid compared to prior assessments. She reports sleeping and eating well. Patient denies suicidal or homicidal ideation, auditory or visual hallucinations, and expresses improved ability to control her thoughts. She was pleasant during interview, able to engage in reality-based conversation, and was easily redirectable.  Patient stated, "I'm doing better. I just stay away from those people and mind my business." She reports continuing her medications without side effects. Staff report improved mood stability, reduced verbal outbursts, and compliance with care. No behavioral disturbances noted overnight.  Objective:  General Appearance: Well-groomed, appropriate for environment Behavior: Calm, cooperative Speech: Normal rate and tone Mood: "Better" Affect: Euthymic, appropriate to content Thought Process: Linear, coherent Thought Content: Residual mild paranoid ideation but no active delusions expressed today Perceptions: Denies hallucinations Cognition: Alert and oriented 3 Insight/Judgment: Limited but improving Memory: Fair Attention/Concentration: Intact Impulse Control: Fair  Vital Signs:  BP 120/62  HR 75  RR 17  Temp 97.9 F  SpO? 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 No aggression, suicidal, or homicidal ideation observed. Tolerating medications well. Depakote level subtherapeutic on 10/2 (42 g/mL); will continue current dosing and reassess with follow-up level.  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 Safety & Monitoring:  Continue inpatient level of care for stabilization.  Maintain q15-minute safety checks.  No acute risk of self-harm or violence at this time.  Continue to monitor for delirium, agitation, or psychosis recurrence.  2. Psychiatric Management:  Continue Depakote DR 250 mg AM / 500 mg HS per neurology recommendation.  Continue Risperidone  0.5 mg HS - consider titration if paranoia returns.  Continue Seroquel  200 mg HS for mood and sleep regulation.  Patient tolerating regimen well, no EPS or sedation reported.  Maintain engagement in group and milieu therapy.  3. Medical Management:  Continue Levothyroxine  75 mcg daily, Losartan 25 mg daily, Simvastatin 20 mg HS.  Monitor blood pressure, thyroid  function, and metabolic labs as indicated.  4. Labs / Monitoring:  Recheck Valproic acid level in 3-5 days to ensure therapeutic range (goal 50-100 g/mL).  Continue to monitor metabolic parameters due to atypical antipsychotic therapy.  5. Collateral / Communication:  Sister, [name], remains primary contact; continue coordination for discharge planning.  Neurology follow-up recommended post-discharge for dementia monitoring.  6. Discharge Planning:  Continue stabilization on current medication regimen.  Anticipate discharge once paranoia and cognition further stabilized; estimated LOS 3-5 days.  Discharge goals: medication compliance, outpatient geriatric psychiatry follow-up, safety plan in place.  Risk Assessment / C-SSRS:  Suicidal Ideation: Denied  Plan or Intent: None  Homicidal Ideation: Denied  C-SSRS Risk Category: No Risk  Protective Factors: Supportive sister, adherence to medications, stable housing  Medical Necessity Statement:  Continued inpatient psychiatric care remains medically necessary for ongoing stabilization, medication titration, and  monitoring of psychotic symptoms and cognitive decline in the context of dementia. The patient continues to benefit from a structured environment to ensure safety, medication adherence, and coordination with outpatient supports.  Total Time Spent: 35 minutes, including patient evaluation, chart review, interdisciplinary communication, and documentation.  Signature:  Millie Manners, MD Consulting Psychiatrist Date: 08/20/2024 Time: 10:30 AM

## 2024-08-20 NOTE — Progress Notes (Signed)
   08/20/24 1014  Psych Admission Type (Psych Patients Only)  Admission Status Involuntary  Psychosocial Assessment  Patient Complaints None  Eye Contact Fair  Facial Expression Animated  Affect Appropriate to circumstance  Speech Logical/coherent  Interaction Assertive  Motor Activity Slow  Appearance/Hygiene Unremarkable  Behavior Characteristics Cooperative;Appropriate to situation  Mood Pleasant  Thought Process  Coherency WDL  Content WDL  Delusions None reported or observed  Perception WDL  Hallucination None reported or observed  Judgment Impaired  Confusion Mild  Danger to Self  Current suicidal ideation? Denies  Agreement Not to Harm Self Yes  Description of Agreement verbal  Danger to Others  Danger to Others None reported or observed

## 2024-08-20 NOTE — Group Note (Signed)
 Date:  08/20/2024 Time:  9:41 PM    Additional Comments:  Did not attend group.  Pamela Benson 08/20/2024, 9:41 PM

## 2024-08-20 NOTE — Plan of Care (Signed)
  Problem: Education: Goal: Ability to state activities that reduce stress will improve Outcome: Progressing   Problem: Self-Concept: Goal: Ability to identify factors that promote anxiety will improve Outcome: Progressing Goal: Level of anxiety will decrease Outcome: Progressing Goal: Ability to modify response to factors that promote anxiety will improve Outcome: Progressing   Problem: Safety: Goal: Ability to disclose and discuss suicidal ideas will improve 08/20/2024 0431 by Birdena Glee DASEN, RN Outcome: Progressing 08/20/2024 0431 by Birdena Glee DASEN, RN Outcome: Progressing Goal: Ability to identify and utilize support systems that promote safety will improve 08/20/2024 0431 by Birdena Glee DASEN, RN Outcome: Progressing 08/20/2024 0431 by Birdena Glee DASEN, RN Outcome: Progressing

## 2024-08-20 NOTE — Progress Notes (Signed)
(  Sleep Hours) - Estimated Sleeping Duration (Last 24 Hours): 9.50-11.25 hours  (Any PRNs that were needed, meds refused, or side effects to meds)- N/A (Any disturbances and when (visitation, over night)-N/A (Concerns raised by the patient)- N/A (SI/HI/AVH)-Denies   08/19/24 2132  Psych Admission Type (Psych Patients Only)  Admission Status Involuntary  Psychosocial Assessment  Patient Complaints None  Eye Contact Fair  Facial Expression Animated  Affect Appropriate to circumstance  Speech Logical/coherent  Interaction Assertive  Motor Activity Slow  Appearance/Hygiene Unremarkable  Behavior Characteristics Cooperative;Appropriate to situation  Mood Pleasant  Aggressive Behavior  Effect No apparent injury  Thought Process  Coherency WDL  Content WDL  Delusions None reported or observed  Perception WDL  Hallucination None reported or observed  Judgment Impaired  Confusion Mild  Danger to Self  Current suicidal ideation? Denies  Agreement Not to Harm Self Yes  Description of Agreement Verbal  Danger to Others  Danger to Others None reported or observed

## 2024-08-21 NOTE — Progress Notes (Signed)
 [facility] MD Progress Note Date/Time: 08/20/2024 10:30 AM     Attending Psychiatrist: Millie Manners, MD Unit: Geriatric Psychiatry  Interval History / Subjective:  Patient was seen and evaluated today on rounds. She appears calm, cooperative, and less paranoid compared to prior assessments. She reports sleeping and eating well. Patient denies suicidal or homicidal ideation, auditory or visual hallucinations, and expresses improved ability to control her thoughts. She was pleasant during interview, able to engage in reality-based conversation, and was easily redirectable.  Patient stated, "patient is less paranoid today able to engage  Objective:  General Appearance: Well-groomed, appropriate for environment Behavior: Calm, cooperative Speech: Normal rate and tone Mood: "Better" Affect: Euthymic, appropriate to content Thought Process: Linear, coherent Thought Content: Residual mild paranoid ideation but no active delusions expressed today Perceptions: Denies hallucinations Cognition: Alert and oriented 3 Insight/Judgment: Limited but improving Memory: Fair Attention/Concentration: Intact Impulse Control: Fair  Vital Signs:  BP 120/62  HR 75  RR 17  Temp 97.9 F  SpO? 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 No aggression, suicidal, or homicidal ideation observed. Tolerating medications well. Depakote level subtherapeutic on 10/2 (42 g/mL); will continue current dosing and reassess with follow-up level.  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 Safety & Monitoring:  Continue inpatient level of care for stabilization.  Maintain q15-minute safety checks.  No acute risk of self-harm  or violence at this time.  Continue to monitor for delirium, agitation, or psychosis recurrence.  2. Psychiatric Management:  Continue Depakote DR 250 mg AM / 500 mg HS per neurology recommendation.  Continue Risperidone  0.5 mg HS - consider titration if paranoia returns.  Continue Seroquel  200 mg HS for mood and sleep regulation.  Patient tolerating regimen well, no EPS or sedation reported.  Maintain engagement in group and milieu therapy.  3. Medical Management:  Continue Levothyroxine  75 mcg daily, Losartan 25 mg daily, Simvastatin 20 mg HS.  Monitor blood pressure, thyroid  function, and metabolic labs as indicated.  4. Labs / Monitoring:  Recheck Valproic acid level in 3-5 days to ensure therapeutic range (goal 50-100 g/mL).  Continue to monitor metabolic parameters due to atypical antipsychotic therapy.  5. Collateral / Communication:  Sister, [name], remains primary contact; continue coordination for discharge planning.  Neurology follow-up recommended post-discharge for dementia monitoring.  6. Discharge Planning:  Continue stabilization on current medication regimen.  Anticipate discharge once paranoia and cognition further stabilized; estimated LOS 3-5 days.  Discharge goals: medication compliance, outpatient geriatric psychiatry follow-up, safety plan in place.  Risk Assessment / C-SSRS:  Suicidal Ideation: Denied  Plan or Intent: None  Homicidal Ideation: Denied  C-SSRS Risk Category: No Risk  Protective Factors: Supportive sister, adherence to medications, stable housing  Medical Necessity Statement:  Continued inpatient psychiatric care remains medically necessary for ongoing stabilization, medication titration, and monitoring of psychotic symptoms and cognitive decline in the context of dementia. The patient continues to benefit from a structured environment to ensure safety, medication adherence, and coordination with outpatient  supports.  Total Time Spent: 35 minutes, including patient evaluation, chart review, interdisciplinary communication, and documentation.  Signature: Millie Manners, MD Consulting Psychiatrist Date: 08/20/2024 Time: 10:30 AM

## 2024-08-21 NOTE — Group Note (Signed)
 Date:  08/21/2024 Time:  4:22 PM  Group Topic/Focus:  Recovery Goals:   The focus of this group is to identify appropriate goals for recovery and establish a plan to achieve them.    Participation Level:  Active  Participation Quality:  Appropriate  Affect:  Appropriate  Cognitive:  Appropriate  Insight: Appropriate  Engagement in Group:  Engaged  Modes of Intervention:  Discussion  Arland Nutting 08/21/2024, 4:22 PM

## 2024-08-21 NOTE — Progress Notes (Signed)
 Estimated Sleeping Duration (Last 24 Hours): 8.00-9.00 hours    08/20/24 2319  Psych Admission Type (Psych Patients Only)  Admission Status Involuntary  Psychosocial Assessment  Patient Complaints None  Eye Contact Fair  Facial Expression Animated  Affect Appropriate to circumstance  Speech Logical/coherent  Interaction Assertive  Motor Activity Slow  Appearance/Hygiene Unremarkable  Behavior Characteristics Cooperative;Appropriate to situation  Mood Pleasant  Thought Process  Coherency WDL  Content WDL  Delusions None reported or observed  Perception WDL  Hallucination None reported or observed  Judgment Impaired  Confusion Mild  Danger to Self  Current suicidal ideation? Denies  Agreement Not to Harm Self Yes  Description of Agreement verbal  Danger to Others  Danger to Others None reported or observed

## 2024-08-21 NOTE — Plan of Care (Signed)
  Problem: Education: Goal: Ability to state activities that reduce stress will improve Outcome: Progressing   Problem: Coping: Goal: Ability to identify and develop effective coping behavior will improve Outcome: Progressing   Problem: Self-Concept: Goal: Ability to identify factors that promote anxiety will improve Outcome: Progressing Goal: Level of anxiety will decrease Outcome: Progressing Goal: Ability to modify response to factors that promote anxiety will improve Outcome: Progressing   Problem: Coping: Goal: Coping ability will improve Outcome: Progressing   Problem: Safety: Goal: Ability to disclose and discuss suicidal ideas will improve Outcome: Progressing Goal: Ability to identify and utilize support systems that promote safety will improve Outcome: Progressing   Problem: Self-Concept: Goal: Will verbalize positive feelings about self Outcome: Progressing   Problem: Education: Goal: Knowledge of [facility] Education information/materials will improve Outcome: Progressing Goal: Emotional status will improve Outcome: Progressing Goal: Mental status will improve Outcome: Progressing Goal: Verbalization of understanding the information provided will improve Outcome: Progressing   Problem: Activity: Goal: Interest or engagement in activities will improve Outcome: Progressing Goal: Sleeping patterns will improve Outcome: Progressing   Problem: Coping: Goal: Ability to verbalize frustrations and anger appropriately will improve Outcome: Progressing Goal: Ability to demonstrate self-control will improve Outcome: Progressing   Problem: Health Behavior/Discharge Planning: Goal: Identification of resources available to assist in meeting health care needs will improve Outcome: Progressing Goal: Compliance with treatment plan for underlying cause of condition will improve Outcome: Progressing   Problem: Physical Regulation: Goal: Ability to maintain  clinical measurements within normal limits will improve Outcome: Progressing   Problem: Safety: Goal: Periods of time without injury will increase Outcome: Progressing

## 2024-08-21 NOTE — Plan of Care (Signed)
  Problem: Education: Goal: Ability to state activities that reduce stress will improve Outcome: Progressing   Problem: Coping: Goal: Ability to identify and develop effective coping behavior will improve Outcome: Progressing   Problem: Self-Concept: Goal: Level of anxiety will decrease Outcome: Progressing   Problem: Coping: Goal: Coping ability will improve Outcome: Progressing

## 2024-08-21 NOTE — Group Note (Signed)
 [facility] LCSW Group Therapy Note   Group Date: 08/21/2024 Start Time: 1300 End Time: 1400   Type of Therapy/Topic:  Group Therapy:  Balance in Life  Participation Level:  Did Not Attend   Description of Group:    This group will address the concept of balance and how it feels and looks when one is unbalanced. Patients will be encouraged to process areas in their lives that are out of balance, and identify reasons for remaining unbalanced. Facilitators will guide patients utilizing problem- solving interventions to address and correct the stressor making their life unbalanced. Understanding and applying boundaries will be explored and addressed for obtaining  and maintaining a balanced life. Patients will be encouraged to explore ways to assertively make their unbalanced needs known to significant others in their lives, using other group members and facilitator for support and feedback.  Therapeutic Goals: Patient will identify two or more emotions or situations they have that consume much of in their lives. Patient will identify signs/triggers that life has become out of balance:  Patient will identify two ways to set boundaries in order to achieve balance in their lives:  Patient will demonstrate ability to communicate their needs through discussion and/or role plays  Summary of Patient Progress:    Patient did not attend.    Therapeutic Modalities:   Cognitive Behavioral Therapy Solution-Focused Therapy Assertiveness Training   Rexene LELON Mae, LCSWA

## 2024-08-21 NOTE — Plan of Care (Signed)
 Problem: Education: Goal: Ability to state activities that reduce stress will improve 08/21/2024 1656 by Shirley Jon FALCON, RN Outcome: Progressing 08/21/2024 1655 by Shirley Jon FALCON, RN Outcome: Progressing   Problem: Coping: Goal: Ability to identify and develop effective coping behavior will improve 08/21/2024 1656 by Shirley Jon FALCON, RN Outcome: Progressing 08/21/2024 1655 by Shirley Jon FALCON, RN Outcome: Progressing   Problem: Self-Concept: Goal: Ability to identify factors that promote anxiety will improve 08/21/2024 1656 by Shirley Jon FALCON, RN Outcome: Progressing 08/21/2024 1655 by Shirley Jon FALCON, RN Outcome: Progressing Goal: Level of anxiety will decrease 08/21/2024 1656 by Shirley Jon FALCON, RN Outcome: Progressing 08/21/2024 1655 by Shirley Jon FALCON, RN Outcome: Progressing Goal: Ability to modify response to factors that promote anxiety will improve 08/21/2024 1656 by Shirley Jon FALCON, RN Outcome: Progressing 08/21/2024 1655 by Shirley Jon FALCON, RN Outcome: Progressing   Problem: Coping: Goal: Coping ability will improve 08/21/2024 1656 by Shirley Jon FALCON, RN Outcome: Progressing 08/21/2024 1655 by Shirley Jon FALCON, RN Outcome: Progressing   Problem: Safety: Goal: Ability to disclose and discuss suicidal ideas will improve 08/21/2024 1656 by Shirley Jon FALCON, RN Outcome: Progressing 08/21/2024 1655 by Shirley Jon FALCON, RN Outcome: Progressing Goal: Ability to identify and utilize support systems that promote safety will improve 08/21/2024 1656 by Shirley Jon FALCON, RN Outcome: Progressing 08/21/2024 1655 by Shirley Jon FALCON, RN Outcome: Progressing   Problem: Self-Concept: Goal: Will verbalize positive feelings about self 08/21/2024 1656 by Shirley Jon FALCON, RN Outcome: Progressing 08/21/2024 1655 by Shirley Jon FALCON, RN Outcome: Progressing   Problem: Education: Goal: Knowledge of [facility] Education information/materials will  improve 08/21/2024 1656 by Shirley Jon FALCON, RN Outcome: Progressing 08/21/2024 1655 by Shirley Jon FALCON, RN Outcome: Progressing Goal: Emotional status will improve 08/21/2024 1656 by Shirley Jon FALCON, RN Outcome: Progressing 08/21/2024 1655 by Shirley Jon FALCON, RN Outcome: Progressing Goal: Mental status will improve 08/21/2024 1656 by Shirley Jon FALCON, RN Outcome: Progressing 08/21/2024 1655 by Shirley Jon FALCON, RN Outcome: Progressing Goal: Verbalization of understanding the information provided will improve 08/21/2024 1656 by Shirley Jon FALCON, RN Outcome: Progressing 08/21/2024 1655 by Shirley Jon FALCON, RN Outcome: Progressing   Problem: Activity: Goal: Interest or engagement in activities will improve 08/21/2024 1656 by Shirley Jon FALCON, RN Outcome: Progressing 08/21/2024 1655 by Shirley Jon FALCON, RN Outcome: Progressing Goal: Sleeping patterns will improve 08/21/2024 1656 by Shirley Jon FALCON, RN Outcome: Progressing 08/21/2024 1655 by Shirley Jon FALCON, RN Outcome: Progressing   Problem: Coping: Goal: Ability to verbalize frustrations and anger appropriately will improve 08/21/2024 1656 by Shirley Jon FALCON, RN Outcome: Progressing 08/21/2024 1655 by Shirley Jon FALCON, RN Outcome: Progressing Goal: Ability to demonstrate self-control will improve 08/21/2024 1656 by Shirley Jon FALCON, RN Outcome: Progressing 08/21/2024 1655 by Shirley Jon FALCON, RN Outcome: Progressing   Problem: Health Behavior/Discharge Planning: Goal: Identification of resources available to assist in meeting health care needs will improve 08/21/2024 1656 by Shirley Jon FALCON, RN Outcome: Progressing 08/21/2024 1655 by Shirley Jon FALCON, RN Outcome: Progressing Goal: Compliance with treatment plan for underlying cause of condition will improve 08/21/2024 1656 by Shirley Jon FALCON, RN Outcome: Progressing 08/21/2024 1655 by Shirley Jon FALCON, RN Outcome: Progressing   Problem: Physical  Regulation: Goal: Ability to maintain clinical measurements within normal limits will improve 08/21/2024 1656 by Shirley Jon FALCON, RN Outcome: Progressing 08/21/2024 1655 by Shirley Jon FALCON, RN Outcome: Progressing   Problem: Safety: Goal: Periods of time without injury will increase 08/21/2024  1656 by Shirley Jon FALCON, RN Outcome: Progressing 08/21/2024 1655 by Shirley Jon FALCON, RN Outcome: Progressing

## 2024-08-21 NOTE — Group Note (Signed)
 Date:  08/21/2024 Time:  10:59 PM  Group Topic/Focus:  Wrap-Up Group:   The focus of this group is to help patients review their daily goal of treatment and discuss progress on daily workbooks.    Participation Level:  Active  Participation Quality:  Appropriate  Affect:  Appropriate  Cognitive:  Oriented  Insight: Good  Engagement in Group:  Improving  Modes of Intervention:  Discussion  Additional Comments:    Pamela Benson 08/21/2024, 10:59 PM

## 2024-08-21 NOTE — Group Note (Signed)
 Date:  08/21/2024 Time:  11:19 AM  Group Topic/Focus:  Coping With Mental Health Crisis:   The purpose of this group is to help patients identify strategies for coping with mental health crisis.  Group discusses possible causes of crisis and ways to manage them effectively.    Participation Level:  Active  Participation Quality:  Appropriate  Affect:  Appropriate  Cognitive:  Appropriate  Insight: Appropriate  Engagement in Group:  Engaged  Modes of Intervention:  Discussion  Pamela Benson 08/21/2024, 11:19 AM

## 2024-08-22 DIAGNOSIS — F03918 Unspecified dementia, unspecified severity, with other behavioral disturbance: Secondary | ICD-10-CM | POA: Diagnosis not present

## 2024-08-22 DIAGNOSIS — F22 Delusional disorders: Secondary | ICD-10-CM | POA: Diagnosis not present

## 2024-08-22 MED ORDER — QUETIAPINE FUMARATE 100 MG PO TABS
100.0000 mg | ORAL_TABLET | Freq: Every day | ORAL | Status: DC
Start: 2024-08-22 — End: 2024-08-23
  Administered 2024-08-22: 100 mg via ORAL
  Filled 2024-08-22 (×2): qty 1

## 2024-08-22 MED ORDER — RISPERIDONE 1 MG PO TBDP
1.0000 mg | ORAL_TABLET | Freq: Every day | ORAL | Status: DC
Start: 2024-08-22 — End: 2024-08-26
  Administered 2024-08-22 – 2024-08-25 (×4): 1 mg via ORAL
  Filled 2024-08-22 (×4): qty 1

## 2024-08-22 NOTE — Plan of Care (Signed)
  Problem: Education: Goal: Ability to state activities that reduce stress will improve Outcome: Progressing   Problem: Coping: Goal: Ability to identify and develop effective coping behavior will improve Outcome: Progressing   Problem: Self-Concept: Goal: Ability to identify factors that promote anxiety will improve Outcome: Progressing Goal: Level of anxiety will decrease Outcome: Progressing Goal: Ability to modify response to factors that promote anxiety will improve Outcome: Progressing   Problem: Coping: Goal: Coping ability will improve Outcome: Progressing   Problem: Safety: Goal: Ability to disclose and discuss suicidal ideas will improve Outcome: Progressing Goal: Ability to identify and utilize support systems that promote safety will improve Outcome: Progressing   Problem: Self-Concept: Goal: Will verbalize positive feelings about self Outcome: Progressing   Problem: Education: Goal: Knowledge of [facility] Education information/materials will improve Outcome: Progressing Goal: Emotional status will improve Outcome: Progressing Goal: Mental status will improve Outcome: Progressing Goal: Verbalization of understanding the information provided will improve Outcome: Progressing   Problem: Activity: Goal: Interest or engagement in activities will improve Outcome: Progressing Goal: Sleeping patterns will improve Outcome: Progressing   Problem: Coping: Goal: Ability to verbalize frustrations and anger appropriately will improve Outcome: Progressing Goal: Ability to demonstrate self-control will improve Outcome: Progressing   Problem: Health Behavior/Discharge Planning: Goal: Identification of resources available to assist in meeting health care needs will improve Outcome: Progressing Goal: Compliance with treatment plan for underlying cause of condition will improve Outcome: Progressing   Problem: Physical Regulation: Goal: Ability to maintain  clinical measurements within normal limits will improve Outcome: Progressing   Problem: Safety: Goal: Periods of time without injury will increase Outcome: Progressing

## 2024-08-22 NOTE — Progress Notes (Signed)
 Estimated Sleeping Duration (Last 24 Hours): 6.25-7.50 hours    08/21/24 2358  Psych Admission Type (Psych Patients Only)  Admission Status Involuntary  Psychosocial Assessment  Patient Complaints None  Eye Contact Fair  Facial Expression Animated  Affect Appropriate to circumstance  Speech Logical/coherent  Interaction Assertive  Motor Activity Slow  Appearance/Hygiene Unremarkable  Behavior Characteristics Cooperative;Appropriate to situation  Mood Pleasant  Thought Process  Coherency WDL  Content WDL  Delusions None reported or observed  Perception WDL  Hallucination None reported or observed  Judgment Impaired  Confusion Mild  Danger to Self  Current suicidal ideation? Denies  Description of Suicide Plan denies  Self-Injurious Behavior No self-injurious ideation or behavior indicators observed or expressed   Agreement Not to Harm Self Yes  Description of Agreement verbal  Danger to Others  Danger to Others None reported or observed

## 2024-08-22 NOTE — Plan of Care (Signed)
  Problem: Self-Concept: Goal: Level of anxiety will decrease Outcome: Progressing   Problem: Coping: Goal: Coping ability will improve Outcome: Progressing   Problem: Activity: Goal: Interest or engagement in activities will improve Outcome: Progressing

## 2024-08-22 NOTE — Plan of Care (Signed)
  Problem: Self-Concept: Goal: Level of anxiety will decrease Outcome: Progressing   Problem: Coping: Goal: Coping ability will improve Outcome: Progressing   Problem: Self-Concept: Goal: Will verbalize positive feelings about self Outcome: Progressing   Problem: Education: Goal: Verbalization of understanding the information provided will improve Outcome: Progressing

## 2024-08-22 NOTE — Group Note (Signed)
 Recreation Therapy Group Note   Group Topic:Other  Group Date: 08/22/2024 Start Time: 1400 End Time: 1440 Facilitators: Celestia Jeoffrey BRAVO, LRT, CTRS Location: Dayroom  Activity Description/Intervention: Therapeutic Drumming. Patients with peers and staff were given the opportunity to engage in a leader facilitated [facility] Group Empowerment Drumming Circle with staff from the [employer], in partnership with [employer]. Teaching laboratory technician and trained [employer], Norleen Mon leading with LRT observing and documenting intervention and pt response. This evidenced-based practice targets 7 areas of health and wellbeing in the human experience including: stress-reduction, exercise, self-expression, camaraderie/support, nurturing, spirituality, and music-making (leisure).    Goal Area(s) Addresses:  Patient will engage in pro-social way in music group.  Patient will follow directions of drum leader on the first prompt. Patient will demonstrate no behavioral issues during group.  Patient will identify if a reduction in stress level occurs as a result of participation in therapeutic drum circle.     Affect/Mood: Appropriate   Participation Level: Active and Engaged   Participation Quality: Independent   Behavior: Appropriate, Calm, and Cooperative   Speech/Thought Process: Coherent   Insight: Good   Judgement: Good   Modes of Intervention: Music   Patient Response to Interventions:  Attentive, Engaged, and Receptive   Education Outcome:  Acknowledges education   Clinical Observations/Individualized Feedback: Beyonce was active in their participation of session activities and group discussion. Pt interacted well with LRT and peers duration of session.    Plan: Continue to engage patient in RT group sessions 2-3x/week.   Jeoffrey BRAVO Celestia, LRT, CTRS 08/22/2024 4:26 PM

## 2024-08-22 NOTE — Progress Notes (Signed)
 Mood/Behavior: Pleasant and cooperative.    Psych assessment: Denies SI/HI and AVH.    Interaction / Group attendance:  Present in the milieu.  Appropriate interaction with peers and staff.  Attends groups.  Medication/ PRNs: Compliant with medications.   Pain: Denies  15 min checks in place for safety.

## 2024-08-22 NOTE — Progress Notes (Signed)
   08/22/24 2100  Psych Admission Type (Psych Patients Only)  Admission Status Involuntary  Psychosocial Assessment  Patient Complaints None  Eye Contact Fair  Facial Expression Animated  Affect Appropriate to circumstance  Speech Logical/coherent  Interaction Assertive  Motor Activity Slow  Appearance/Hygiene Unremarkable  Behavior Characteristics Cooperative;Appropriate to situation  Mood Pleasant  Thought Process  Coherency WDL  Content WDL  Delusions None reported or observed  Perception WDL  Hallucination None reported or observed  Judgment Impaired  Confusion Mild  Danger to Self  Current suicidal ideation? Denies  Self-Injurious Behavior No self-injurious ideation or behavior indicators observed or expressed   Danger to Others  Danger to Others None reported or observed   Mood/Behavior:  Pleasant and cooperative.  Mild confusion.    Psych assessment: Denies SI/HI and AVH. Denies depression and anxiety.   Interaction / Group attendance:  Present in the milieu. Minimal interaction with peers and staff.     Medication/ PRNs: Compliant with scheduled medications. No PRNs given.   Pain: Denies   15 min checks in place for safety.
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
## 2024-08-22 NOTE — [facility] Group Notes (Signed)
 Adult Psychoeducational Group Note  Date:  08/22/2024 Time:  10:25 PM  Group Topic/Focus:  Wrap-Up Group:   The focus of this group is to help patients review their daily goal of treatment and discuss progress on daily workbooks.  Participation Level:  Did Not Attend  Pamela Benson 08/22/2024, 10:25 PM

## 2024-08-22 NOTE — Group Note (Signed)
 Date:  08/22/2024 Time:  3:07 PM  Group Topic/Focus:  Self Care:   The focus of this group is to help patients understand the importance of self-care in order to improve or restore emotional, physical, spiritual, interpersonal, and financial health.    Participation Level:  Active  Participation Quality:  Appropriate  Affect:  Appropriate  Cognitive:  Appropriate  Insight: Appropriate  Engagement in Group:  Engaged  Modes of Intervention:  Activity and Discussion  Additional Comments:  N/A Pamela Benson Gavel 08/22/2024, 3:07 PM

## 2024-08-23 DIAGNOSIS — F22 Delusional disorders: Secondary | ICD-10-CM | POA: Diagnosis not present

## 2024-08-23 DIAGNOSIS — F03918 Unspecified dementia, unspecified severity, with other behavioral disturbance: Secondary | ICD-10-CM | POA: Diagnosis not present

## 2024-08-23 MED ORDER — TRAZODONE HCL 50 MG PO TABS
50.0000 mg | ORAL_TABLET | Freq: Every day | ORAL | Status: DC
  Administered 2024-08-23 – 2024-08-25 (×3): 50 mg via ORAL
  Filled 2024-08-23 (×3): qty 1

## 2024-08-23 NOTE — Progress Notes (Signed)
 Mood/Behavior:  Pleasant and cooperative.     Psych assessment:  No complaints. Denies SI/HI and AVH.  Interaction / Group attendance:  Present in the milieu.  Appropriate interaction with peers and staff.  Attends groups.  Medication/ PRNs:  Compliant. No PRNs needed.  Pain:  Denies  15 min checks in place for safety.

## 2024-08-23 NOTE — Plan of Care (Signed)
  Problem: Education: Goal: Mental status will improve Outcome: Progressing   Problem: Activity: Goal: Interest or engagement in activities will improve Outcome: Progressing   Problem: Coping: Goal: Ability to verbalize frustrations and anger appropriately will improve Outcome: Progressing   Problem: Health Behavior/Discharge Planning: Goal: Compliance with treatment plan for underlying cause of condition will improve Outcome: Progressing

## 2024-08-23 NOTE — Group Note (Signed)
 LCSW Group Therapy Note   Group Date: 08/23/2024 Start Time: 1315 End Time: 1400   Type of Therapy and Topic:  Group Therapy: Challenging Core Beliefs  Participation Level:  Minimal  Description of Group:  Patients were educated about core beliefs and asked to identify one harmful core belief that they have. Patients were asked to explore from where those beliefs originate. Patients were asked to discuss how those beliefs make them feel and the resulting behaviors of those beliefs. They were then be asked if those beliefs are true and, if so, what evidence they have to support them. Lastly, group members were challenged to replace those negative core beliefs with helpful beliefs.   Therapeutic Goals:   1. Patient will identify harmful core beliefs and explore the origins of such beliefs. 2. Patient will identify feelings and behaviors that result from those core beliefs. 3. Patient will discuss whether such beliefs are true. 4.  Patient will replace harmful core beliefs with helpful ones.  Summary of Patient Progress:  Pamela Benson actively engaged in processing and exploring how core beliefs are formed and how they impact thoughts, feelings, and behaviors. Patient proved open to input from peers and feedback from CSW. Patient demonstrated fair insight into the subject matter, was respectful and supportive of peers, and participated throughout the entire session.  Therapeutic Modalities: Cognitive Behavioral Therapy; Solution-Focused Therapy   Pamela Benson, [place] 08/23/2024  2:08 PM

## 2024-08-23 NOTE — Group Note (Signed)
 Date:  08/23/2024 Time:  10:34 AM  Group Topic/Focus:  Fresh air therapy with music and conversation    Participation Level:  Did Not Attend    Norleen SHAUNNA Bias 08/23/2024, 10:34 AM

## 2024-08-23 NOTE — Progress Notes (Signed)
 [facility] MD Progress Note  08/23/2024 9:30 PM QUETZALI HEINLE  MRN:  [id number] MIKIYAH GLASNER is a 78 y.o. female with a history of dementia is brought to the [facility] for involuntary commitment due to wandering in her neighborhood , agitation, reporting that she is looking for somebody. Patient is admitted to [facility] unit with Q15 min safety monitoring. Multidisciplinary team approach is offered. Medication management; group/milieu therapy is offered.  CALLED SISTER SUSAN [phone] AND left HIPAA compliant voicemail Subjective:  Chart reviewed, case discussed in multidisciplinary meeting, patient seen during rounds.  Patient is noted to be sitting in the day area.  She is unable to answer the orientation questions and she reports month as may and the year is 84.  She reports feeling better.  She wants to go home.  She denies SI/HI/plan.  She remains confused but redirectable.  She denies auditory/visual hallucinations.  She is unable to recall the medications and spite of the provider discussing her medications every day.  She reports she took her medications with no reported side effect. Sleep: Fair  Appetite:  Fair  Past Psychiatric History: see h&P Family History: History reviewed. No pertinent family history. Social History:  Social History   Substance and Sexual Activity  Alcohol  Use Not Currently     Social History   Substance and Sexual Activity  Drug Use Never    Social History   Socioeconomic History   Marital status: Widowed    Spouse name: Not on file   Number of children: Not on file   Years of education: Not on file   Highest education level: Not on file  Occupational History   Not on file  Tobacco Use   Smoking status: Never   Smokeless tobacco: Never  Vaping Use   Vaping status: Never Used  Substance and Sexual Activity   Alcohol  use: Not Currently   Drug use: Never   Sexual activity: Not Currently  Other Topics Concern   Not on file  Social History  Narrative   Not on file   Social Drivers of Health   Financial Resource Strain: Medium Risk (10/07/2023)   Received from [facility]   Overall Financial Resource Strain (CARDIA)    Difficulty of Paying Living Expenses: Somewhat hard  Food Insecurity: No Food Insecurity (08/18/2024)   Hunger Vital Sign    Worried About Running Out of Food in the Last Year: Never true    Ran Out of Food in the Last Year: Never true  Transportation Needs: No Transportation Needs (08/18/2024)   PRAPARE - Administrator, Civil Service (Medical): No    Lack of Transportation (Non-Medical): No  Physical Activity: Not on file  Stress: Not on file  Social Connections: Socially Isolated (08/18/2024)   Social Connection and Isolation Panel    Frequency of Communication with Friends and Family: Twice a week    Frequency of Social Gatherings with Friends and Family: Once a week    Attends Religious Services: Never    Database administrator or Organizations: No    Attends Engineer, structural: Patient declined    Marital Status: Widowed   Past Medical History:  Past Medical History:  Diagnosis Date   Diabetes mellitus without complication (HCC)    Osteopenia after menopause    History reviewed. No pertinent surgical history.  Current Medications: Current Facility-Administered Medications  Medication Dose Route Frequency Provider Last Rate Last Admin   acetaminophen  (TYLENOL ) tablet 650  mg  650 mg Oral Q6H PRN Smith, Annie B, NP       alum & mag hydroxide-simeth (MAALOX/MYLANTA) 200-200-20 MG/5ML suspension 30 mL  30 mL Oral Q4H PRN Smith, Annie B, NP       divalproex (DEPAKOTE) DR tablet 250 mg  250 mg Oral q AM Smith, Annie B, NP   250 mg at 08/23/24 9379   divalproex (DEPAKOTE) DR tablet 500 mg  500 mg Oral QPM Smith, Annie B, NP   500 mg at 08/23/24 1759   levothyroxine  (SYNTHROID ) tablet 75 mcg  75 mcg Oral Q0600 Smith, Annie B, NP   75 mcg at 08/23/24 9379    losartan (COZAAR) tablet 25 mg  25 mg Oral Daily Smith, Annie B, NP   25 mg at 08/23/24 0913   magnesium  hydroxide (MILK OF MAGNESIA) suspension 30 mL  30 mL Oral Daily PRN Smith, Annie B, NP       OLANZapine  (ZYPREXA ) injection 5 mg  5 mg Intramuscular TID PRN Smith, Annie B, NP       OLANZapine  zydis (ZYPREXA ) disintegrating tablet 5 mg  5 mg Oral TID PRN Smith, Annie B, NP       risperiDONE  (RISPERDAL  M-TABS) disintegrating tablet 1 mg  1 mg Oral QHS Ruthmary Occhipinti, MD   1 mg at 08/22/24 2142   simvastatin (ZOCOR) tablet 20 mg  20 mg Oral QHS Smith, Annie B, NP   20 mg at 08/22/24 2142   traZODone (DESYREL) tablet 50 mg  50 mg Oral QHS Aris Even, MD        Lab Results: No results found for this or any previous visit (from the past 48 hours).  Blood Alcohol  level:  Lab Results  Component Value Date   [facility] <15 08/15/2024   ETH <10 11/06/2023    Metabolic Disorder Labs: Lab Results  Component Value Date   HGBA1C 5.9 (H) 08/20/2024   MPG 122.63 08/20/2024   MPG 148 05/26/2023   No results found for: PROLACTIN Lab Results  Component Value Date   CHOL 184 08/20/2024   TRIG 75 08/20/2024   HDL 45 08/20/2024   CHOLHDL 4.1 08/20/2024   VLDL 15 08/20/2024   LDLCALC 124 (H) 08/20/2024   LDLCALC 119 (H) 05/26/2023    Physical Findings: AIMS:  , ,  ,  ,    CIWA:    COWS:      Psychiatric Specialty Exam:  Presentation  General Appearance:  Appropriate for Environment  Eye Contact: Fair  Speech: Normal Rate  Speech Volume: Decreased    Mood and Affect  Mood: Anxious  Affect: Appropriate   Thought Process  Thought Processes: Disorganized  Descriptions of Associations:Loose  Orientation:Partial  Thought Content:Delusions Improving  Hallucinations:Hallucinations: None  Suicidal Thoughts:Suicidal Thoughts: No  Homicidal Thoughts:Homicidal Thoughts: No   Sensorium  Memory: Immediate Poor; Recent Poor; Remote  Poor  Judgment: Impaired  Insight: Shallow   Executive Functions  Concentration: Fair  Attention Span: Fair  Recall: Poor  Fund of Knowledge: Fair  Language: Fair   Psychomotor Activity  Psychomotor Activity: Psychomotor Activity: Normal  Musculoskeletal: Strength & Muscle Tone: within normal limits Gait & Station: normal Assets  Assets: Manufacturing systems engineer; Desire for Improvement    Physical Exam: Physical Exam Vitals and nursing note reviewed.    ROS Blood pressure (!) 120/31, pulse 69, temperature 98.7 F (37.1 C), resp. rate 20, height 5' 4 (1.626 m), weight 99.8 kg, SpO2 100%. Body mass index is 37.76 kg/m.  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 Psychiatric Diagnoses and Treatment:  Risperdal  increased 1mg  at bedtime Seroquel  reduced to 100mg  and then discontinued Depakote 250mg  QAM +500mg  QHS  4. Discharge Planning:   -- Social work and case management to assist with discharge planning and identification of hospital follow-up needs prior to discharge  -- Estimated LOS: 3-4 days  Yasmyn Bellisario, MD 08/23/2024, 9:30 PM

## 2024-08-23 NOTE — Group Note (Signed)
 Recreation Therapy Group Note   Group Topic:Stress Management  Group Date: 08/23/2024 Start Time: 1400 End Time: 1500 Facilitators: Celestia Jeoffrey BRAVO, LRT, CTRS Location: Courtyard  Group Description: Stress Jenga. LRT and pts played games of Jenga. LRT prompted group discussion on the physical signs and symptoms of stress, similar to the feeling when you're playing Jenga and trying to remove a wooden block from the stack without it all collapsing. LRT and pt discussed the physical and mental signs of stress, as well as coping skills to manage them.   Goal Area(s) Addressed: Patient will identify physical symptoms of stress. Patient will identify coping skills for stress. Patient will build frustration tolerance skills.  Patient will increase communication.    Affect/Mood: Appropriate   Participation Level: Active and Engaged   Participation Quality: Independent   Behavior: Alert, Calm, and Cooperative   Speech/Thought Process: Coherent   Insight: Fair   Judgement: Fair    Modes of Intervention: Cooperative Play and Problem-solving   Patient Response to Interventions:  Attentive, Engaged, and Receptive   Education Outcome:  Acknowledges education   Clinical Observations/Individualized Feedback: Pamela Benson was active in their participation of session activities and group discussion. Pt interacted well with LRT and peers duration of session.    Plan: Continue to engage patient in RT group sessions 2-3x/week.   Jeoffrey BRAVO Celestia, LRT, CTRS 08/23/2024 4:51 PM

## 2024-08-23 NOTE — Plan of Care (Signed)
  Problem: Education: Goal: Ability to state activities that reduce stress will improve Outcome: Progressing   Problem: Coping: Goal: Ability to identify and develop effective coping behavior will improve Outcome: Progressing   Problem: Self-Concept: Goal: Ability to identify factors that promote anxiety will improve Outcome: Progressing Goal: Level of anxiety will decrease Outcome: Progressing Goal: Ability to modify response to factors that promote anxiety will improve Outcome: Progressing   Problem: Coping: Goal: Coping ability will improve Outcome: Progressing   Problem: Safety: Goal: Ability to disclose and discuss suicidal ideas will improve Outcome: Progressing Goal: Ability to identify and utilize support systems that promote safety will improve Outcome: Progressing   Problem: Self-Concept: Goal: Will verbalize positive feelings about self Outcome: Progressing   Problem: Education: Goal: Knowledge of [facility] Education information/materials will improve Outcome: Progressing Goal: Emotional status will improve Outcome: Progressing Goal: Mental status will improve Outcome: Progressing Goal: Verbalization of understanding the information provided will improve Outcome: Progressing   Problem: Activity: Goal: Interest or engagement in activities will improve Outcome: Progressing Goal: Sleeping patterns will improve Outcome: Progressing   Problem: Coping: Goal: Ability to verbalize frustrations and anger appropriately will improve Outcome: Progressing Goal: Ability to demonstrate self-control will improve Outcome: Progressing   Problem: Health Behavior/Discharge Planning: Goal: Identification of resources available to assist in meeting health care needs will improve Outcome: Progressing Goal: Compliance with treatment plan for underlying cause of condition will improve Outcome: Progressing   Problem: Physical Regulation: Goal: Ability to maintain  clinical measurements within normal limits will improve Outcome: Progressing   Problem: Safety: Goal: Periods of time without injury will increase Outcome: Progressing

## 2024-08-23 NOTE — Progress Notes (Signed)
   08/23/24 2100  Psych Admission Type (Psych Patients Only)  Admission Status Involuntary  Psychosocial Assessment  Patient Complaints None  Eye Contact Fair  Facial Expression Animated  Affect Appropriate to circumstance  Speech Logical/coherent  Interaction Assertive  Motor Activity Slow  Appearance/Hygiene In scrubs;Unremarkable  Behavior Characteristics Cooperative;Appropriate to situation  Mood Pleasant  Thought Process  Coherency WDL  Content WDL  Delusions None reported or observed  Perception WDL  Hallucination None reported or observed  Judgment Impaired  Confusion Mild  Danger to Self  Current suicidal ideation? Denies  Danger to Others  Danger to Others None reported or observed    Mood/Behavior:  Pleasant and cooperative.  Mild confusion.    Psych assessment: Denies SI/HI and AVH. Denies depression and anxiety.   Interaction / Group attendance:  Present in the milieu. Minimal interaction with peers and staff.     Medication/ PRNs: Compliant with scheduled medications. No PRNs given.   Pain: Denies   15 min checks in place for safety.

## 2024-08-23 NOTE — Group Note (Signed)
 Date:  08/23/2024 Time:  8:40 PM  Group Topic/Focus:  Wrap-Up Group:   The focus of this group is to help patients review their daily goal of treatment and discuss progress on daily workbooks.    Participation Level:  Active  Participation Quality:  Appropriate  Affect:  Appropriate  Cognitive:  Appropriate  Insight: Appropriate  Engagement in Group:  Engaged  Modes of Intervention:  Discussion  Additional Comments:    Pamela Benson 08/23/2024, 8:40 PM

## 2024-08-24 DIAGNOSIS — F22 Delusional disorders: Secondary | ICD-10-CM | POA: Diagnosis not present

## 2024-08-24 DIAGNOSIS — F03918 Unspecified dementia, unspecified severity, with other behavioral disturbance: Secondary | ICD-10-CM | POA: Diagnosis not present

## 2024-08-24 NOTE — Progress Notes (Signed)
   08/24/24 2200  Psychosocial Assessment  Patient Complaints None  Eye Contact Fair  Facial Expression Animated  Affect Appropriate to circumstance  Speech Logical/coherent  Interaction Assertive  Motor Activity Slow  Appearance/Hygiene Unremarkable  Behavior Characteristics Cooperative;Appropriate to situation  Mood Pleasant  Thought Process  Coherency WDL  Content WDL  Delusions None reported or observed  Perception WDL  Hallucination None reported or observed  Judgment Impaired  Confusion Mild  Danger to Self  Current suicidal ideation? Denies  Self-Injurious Behavior No self-injurious ideation or behavior indicators observed or expressed   Danger to Others  Danger to Others None reported or observed   Mood/Behavior:  Pleasant and cooperative.  Mild confusion.    Psych assessment: Denies SI/HI and AVH. Denies depression and anxiety.   Interaction / Group attendance:  Present in the milieu. Minimal interaction with peers and staff.     Medication/ PRNs: Compliant with scheduled medications. No PRNs given.   Pain: Denies   15 min checks in place for safety.

## 2024-08-24 NOTE — Plan of Care (Signed)
  Problem: Education: Goal: Ability to state activities that reduce stress will improve Outcome: Progressing   Problem: Coping: Goal: Ability to identify and develop effective coping behavior will improve Outcome: Progressing   Problem: Self-Concept: Goal: Ability to identify factors that promote anxiety will improve Outcome: Progressing Goal: Level of anxiety will decrease Outcome: Progressing Goal: Ability to modify response to factors that promote anxiety will improve Outcome: Progressing   Problem: Coping: Goal: Coping ability will improve Outcome: Progressing   Problem: Safety: Goal: Ability to disclose and discuss suicidal ideas will improve Outcome: Progressing Goal: Ability to identify and utilize support systems that promote safety will improve Outcome: Progressing   Problem: Self-Concept: Goal: Will verbalize positive feelings about self Outcome: Progressing   Problem: Education: Goal: Knowledge of [facility] Education information/materials will improve Outcome: Progressing Goal: Emotional status will improve Outcome: Progressing Goal: Mental status will improve Outcome: Progressing Goal: Verbalization of understanding the information provided will improve Outcome: Progressing   Problem: Activity: Goal: Interest or engagement in activities will improve Outcome: Progressing Goal: Sleeping patterns will improve Outcome: Progressing   Problem: Coping: Goal: Ability to verbalize frustrations and anger appropriately will improve Outcome: Progressing Goal: Ability to demonstrate self-control will improve Outcome: Progressing   Problem: Health Behavior/Discharge Planning: Goal: Identification of resources available to assist in meeting health care needs will improve Outcome: Progressing Goal: Compliance with treatment plan for underlying cause of condition will improve Outcome: Progressing   Problem: Physical Regulation: Goal: Ability to maintain  clinical measurements within normal limits will improve Outcome: Progressing   Problem: Safety: Goal: Periods of time without injury will increase Outcome: Progressing

## 2024-08-24 NOTE — Progress Notes (Signed)
 Patient is a voluntary admission to [facility] for paranoia with dementia.  Patient is calm, cooperative and ambulates with a walker.  Interacts well with staff and peers and participates in group.  She denies SI, HI, AVH, anxiety and depression.  Plans are to discharge Friday.

## 2024-08-24 NOTE — BH IP Treatment Plan (Signed)
 Interdisciplinary Treatment and Diagnostic Plan Update  08/24/2024 Time of Session: 3:00 PM  Pamela Benson MRN: [id number]  Principal Diagnosis: Delusional disorder [facility])  Secondary Diagnoses: Principal Problem:   Delusional disorder (HCC)   Current Medications:  Current Facility-Administered Medications  Medication Dose Route Frequency Provider Last Rate Last Admin   acetaminophen  (TYLENOL ) tablet 650 mg  650 mg Oral Q6H PRN Smith, Annie B, NP       alum & mag hydroxide-simeth (MAALOX/MYLANTA) 200-200-20 MG/5ML suspension 30 mL  30 mL Oral Q4H PRN Smith, Annie B, NP       divalproex (DEPAKOTE) DR tablet 250 mg  250 mg Oral q AM Smith, Annie B, NP   250 mg at 08/24/24 0710   divalproex (DEPAKOTE) DR tablet 500 mg  500 mg Oral QPM Smith, Annie B, NP   500 mg at 08/23/24 1759   levothyroxine  (SYNTHROID ) tablet 75 mcg  75 mcg Oral Q0600 Smith, Annie B, NP   75 mcg at 08/24/24 0710   losartan (COZAAR) tablet 25 mg  25 mg Oral Daily Smith, Annie B, NP   25 mg at 08/24/24 0941   magnesium  hydroxide (MILK OF MAGNESIA) suspension 30 mL  30 mL Oral Daily PRN Smith, Annie B, NP       OLANZapine  (ZYPREXA ) injection 5 mg  5 mg Intramuscular TID PRN Smith, Annie B, NP       OLANZapine  zydis (ZYPREXA ) disintegrating tablet 5 mg  5 mg Oral TID PRN Smith, Annie B, NP       risperiDONE  (RISPERDAL  M-TABS) disintegrating tablet 1 mg  1 mg Oral QHS Jadapalle, Sree, MD   1 mg at 08/23/24 2138   simvastatin (ZOCOR) tablet 20 mg  20 mg Oral QHS Smith, Annie B, NP   20 mg at 08/23/24 2138   traZODone (DESYREL) tablet 50 mg  50 mg Oral QHS Jadapalle, Sree, MD   50 mg at 08/23/24 2138   PTA Medications: Medications Prior to Admission  Medication Sig Dispense Refill Last Dose/Taking   alendronate  (FOSAMAX ) 70 MG tablet Take 70 mg by mouth once a week. (Patient not taking: Reported on 08/16/2024)      diazepam (VALIUM) 5 MG tablet Take 5 mg by mouth every 8 (eight) hours as needed. (Patient not taking: Reported  on 08/16/2024)      divalproex (DEPAKOTE) 125 MG DR tablet Take 125 mg twice a day      divalproex (DEPAKOTE) 250 MG DR tablet Take 250-500 mg by mouth.  Take 250 mg by mouth in the morning and 250 mg in the evening. 250 mg in the morning and 500 mg at night.      estradiol  (ESTRACE ) 0.1 MG/GM vaginal cream Estrogen Cream Instruction Discard applicator Apply pea sized amount to tip of finger to urethra before bed. Wash hands well after application. Use Monday, Wednesday and Friday (Patient not taking: Reported on 08/16/2024) 42.5 g 12    furosemide (LASIX) 20 MG tablet Take 20 mg by mouth daily.      levothyroxine  (SYNTHROID ) 75 MCG tablet Take 1 tablet (75 mcg total) by mouth daily at 6 (six) AM. 30 tablet 11    losartan (COZAAR) 25 MG tablet Take 25 mg by mouth daily.      polyvinyl alcohol  (LIQUIFILM TEARS) 1.4 % ophthalmic solution Place 1 drop into both eyes as needed for dry eyes. 15 mL 0    QUEtiapine  (SEROQUEL ) 200 MG tablet Take 200 mg by mouth at bedtime.  QUEtiapine  (SEROQUEL ) 400 MG tablet Take 400 mg by mouth at bedtime.      risperiDONE  (RISPERDAL ) 0.5 MG tablet Take 0.25 mg by mouth at bedtime.      simvastatin (ZOCOR) 20 MG tablet Take 20 mg by mouth at bedtime.       Patient Stressors: Marital or family conflict   Traumatic event    Patient Strengths: Ability for insight  Manufacturing systems engineer  Motivation for treatment/growth  Supportive family/friends   Treatment Modalities: Medication Management, Group therapy, Case management,  1 to 1 session with clinician, Psychoeducation, Recreational therapy.   Physician Treatment Plan for Primary Diagnosis: Delusional disorder [facility]) Long Term Goal(s): Improvement in symptoms so as ready for discharge   Short Term Goals: Ability to identify changes in lifestyle to reduce recurrence of condition will improve Ability to verbalize feelings will improve Ability to identify and develop effective coping behaviors will  improve Ability to maintain clinical measurements within normal limits will improve  Medication Management: Evaluate patient's response, side effects, and tolerance of medication regimen.  Therapeutic Interventions: 1 to 1 sessions, Unit Group sessions and Medication administration.  Evaluation of Outcomes: Adequate for Discharge  Physician Treatment Plan for Secondary Diagnosis: Principal Problem:   Delusional disorder (HCC)  Long Term Goal(s): Improvement in symptoms so as ready for discharge   Short Term Goals: Ability to identify changes in lifestyle to reduce recurrence of condition will improve Ability to verbalize feelings will improve Ability to identify and develop effective coping behaviors will improve Ability to maintain clinical measurements within normal limits will improve     Medication Management: Evaluate patient's response, side effects, and tolerance of medication regimen.  Therapeutic Interventions: 1 to 1 sessions, Unit Group sessions and Medication administration.  Evaluation of Outcomes: Adequate for Discharge   RN Treatment Plan for Primary Diagnosis: Delusional disorder [facility]) Long Term Goal(s): Knowledge of disease and therapeutic regimen to maintain health will improve  Short Term Goals: Ability to remain free from injury will improve, Ability to verbalize frustration and anger appropriately will improve, Ability to demonstrate self-control, Ability to participate in decision making will improve, Ability to verbalize feelings will improve, Ability to disclose and discuss suicidal ideas, Ability to identify and develop effective coping behaviors will improve, and Compliance with prescribed medications will improve  Medication Management: RN will administer medications as ordered by provider, will assess and evaluate patient's response and provide education to patient for prescribed medication. RN will report any adverse and/or side effects to prescribing  provider.  Therapeutic Interventions: 1 on 1 counseling sessions, Psychoeducation, Medication administration, Evaluate responses to treatment, Monitor vital signs and CBGs as ordered, Perform/monitor CIWA, COWS, AIMS and Fall Risk screenings as ordered, Perform wound care treatments as ordered.  Evaluation of Outcomes: Adequate for Discharge   LCSW Treatment Plan for Primary Diagnosis: Delusional disorder [facility]) Long Term Goal(s): Safe transition to appropriate next level of care at discharge, Engage patient in therapeutic group addressing interpersonal concerns.  Short Term Goals: Engage patient in aftercare planning with referrals and resources, Increase social support, Increase ability to appropriately verbalize feelings, Increase emotional regulation, Facilitate acceptance of mental health diagnosis and concerns, Facilitate patient progression through stages of change regarding substance use diagnoses and concerns, Identify triggers associated with mental health/substance abuse issues, and Increase skills for wellness and recovery  Therapeutic Interventions: Assess for all discharge needs, 1 to 1 time with Social worker, Explore available resources and support systems, Assess for adequacy in community support network, Educate family and  significant other(s) on suicide prevention, Complete Psychosocial Assessment, Interpersonal group therapy.  Evaluation of Outcomes: Adequate for Discharge   Progress in Treatment: Attending groups: No. Participating in groups: No. Taking medication as prescribed: Yes. Toleration medication: Yes. Family/Significant other contact made: No, will contact:  CSW will contact if given permission  Patient understands diagnosis: No. Discussing patient identified problems/goals with staff: Yes. Medical problems stabilized or resolved: Yes. Denies suicidal/homicidal ideation: Yes. Issues/concerns per patient self-inventory: No. Other: None    New problem(s)  identified: No, Describe:  None identified Update 08/24/24: No changes at this time   New Short Term/Long Term Goal(s):  elimination of symptoms of psychosis, medication management for mood stabilization; elimination of SI thoughts; development of comprehensive mental wellness plan. Update 08/24/24: No changes at this time     Patient Goals:   I want to leave here , go back to my home Update 08/24/24: No changes at this time     Discharge Plan or Barriers: CSW will assist with appropriate discharge planning Update 08/24/24: No changes at this time     Reason for Continuation of Hospitalization: Delusions  Hallucinations Medication stabilization   Estimated Length of Stay: 1 to 7 days Update 08/24/24: 08/26/2024      Last 3 [place] Suicide Severity Risk Score: Flowsheet Row Admission (Current) from 08/18/2024 in [facility] ED from 08/15/2024 in [facility] Emergency Department at [facility] ED from 11/06/2023 in [facility] Emergency Department at [facility]  C-SSRS RISK CATEGORY No Risk No Risk No Risk    Last PHQ 2/9 Scores:     No data to display          Scribe for Treatment Team: Lum JONETTA Croft, [place] 08/24/2024 4:18 PM

## 2024-08-24 NOTE — Progress Notes (Signed)
 [facility] MD Progress Note  08/24/2024 12:01 PM AUDRYANNA ZURITA  MRN:  [id number] KENNIDY LAMKE is a 78 y.o. female with a history of dementia is brought to the [facility] for involuntary commitment due to wandering in her neighborhood , agitation, reporting that she is looking for somebody. Patient is admitted to [facility] unit with Q15 min safety monitoring. Multidisciplinary team approach is offered. Medication management; group/milieu therapy is offered.  CALLED SISTER SUSAN [phone] AND left HIPAA compliant voicemail  Subjective:  Chart reviewed, case discussed in multidisciplinary meeting, patient seen during rounds.  Patient is noted to be resting in her room.  She reports feeling tired as she had poor sleep for some night due to the disturbance from another..  She reports that her medications are working well and denies any auditory/visual hallucinations.  She denies any side effects including EPS.  She denies auditory/visual hallucinations and denies SI/HI/plan.  She has fair appetite and sleep.  Discussed discharge planning for Friday.    Sleep: Fair  Appetite:  Fair  Past Psychiatric History: see h&P Family History: History reviewed. No pertinent family history. Social History:  Social History   Substance and Sexual Activity  Alcohol  Use Not Currently     Social History   Substance and Sexual Activity  Drug Use Never    Social History   Socioeconomic History   Marital status: Widowed    Spouse name: Not on file   Number of children: Not on file   Years of education: Not on file   Highest education level: Not on file  Occupational History   Not on file  Tobacco Use   Smoking status: Never   Smokeless tobacco: Never  Vaping Use   Vaping status: Never Used  Substance and Sexual Activity   Alcohol  use: Not Currently   Drug use: Never   Sexual activity: Not Currently  Other Topics Concern   Not on file  Social History Narrative   Not on file   Social Drivers of  Health   Financial Resource Strain: Medium Risk (10/07/2023)   Received from [facility]   Overall Financial Resource Strain (CARDIA)    Difficulty of Paying Living Expenses: Somewhat hard  Food Insecurity: No Food Insecurity (08/18/2024)   Hunger Vital Sign    Worried About Running Out of Food in the Last Year: Never true    Ran Out of Food in the Last Year: Never true  Transportation Needs: No Transportation Needs (08/18/2024)   PRAPARE - Administrator, Civil Service (Medical): No    Lack of Transportation (Non-Medical): No  Physical Activity: Not on file  Stress: Not on file  Social Connections: Socially Isolated (08/18/2024)   Social Connection and Isolation Panel    Frequency of Communication with Friends and Family: Twice a week    Frequency of Social Gatherings with Friends and Family: Once a week    Attends Religious Services: Never    Database administrator or Organizations: No    Attends Engineer, structural: Patient declined    Marital Status: Widowed   Past Medical History:  Past Medical History:  Diagnosis Date   Diabetes mellitus without complication (HCC)    Osteopenia after menopause    History reviewed. No pertinent surgical history.  Current Medications: Current Facility-Administered Medications  Medication Dose Route Frequency Provider Last Rate Last Admin   acetaminophen  (TYLENOL ) tablet 650 mg  650 mg Oral Q6H PRN Smith, Annie B, NP  alum & mag hydroxide-simeth (MAALOX/MYLANTA) 200-200-20 MG/5ML suspension 30 mL  30 mL Oral Q4H PRN Smith, Annie B, NP       divalproex (DEPAKOTE) DR tablet 250 mg  250 mg Oral q AM Smith, Annie B, NP   250 mg at 08/24/24 0710   divalproex (DEPAKOTE) DR tablet 500 mg  500 mg Oral QPM Smith, Annie B, NP   500 mg at 08/23/24 1759   levothyroxine  (SYNTHROID ) tablet 75 mcg  75 mcg Oral Q0600 Smith, Annie B, NP   75 mcg at 08/24/24 0710   losartan (COZAAR) tablet 25 mg  25 mg Oral Daily  Smith, Annie B, NP   25 mg at 08/24/24 0941   magnesium  hydroxide (MILK OF MAGNESIA) suspension 30 mL  30 mL Oral Daily PRN Smith, Annie B, NP       OLANZapine  (ZYPREXA ) injection 5 mg  5 mg Intramuscular TID PRN Smith, Annie B, NP       OLANZapine  zydis (ZYPREXA ) disintegrating tablet 5 mg  5 mg Oral TID PRN Smith, Annie B, NP       risperiDONE  (RISPERDAL  M-TABS) disintegrating tablet 1 mg  1 mg Oral QHS Skip Litke, MD   1 mg at 08/23/24 2138   simvastatin (ZOCOR) tablet 20 mg  20 mg Oral QHS Smith, Annie B, NP   20 mg at 08/23/24 2138   traZODone (DESYREL) tablet 50 mg  50 mg Oral QHS Kerington Hildebrant, MD   50 mg at 08/23/24 2138    Lab Results: No results found for this or any previous visit (from the past 48 hours).  Blood Alcohol  level:  Lab Results  Component Value Date   [facility] <15 08/15/2024   ETH <10 11/06/2023    Metabolic Disorder Labs: Lab Results  Component Value Date   HGBA1C 5.9 (H) 08/20/2024   MPG 122.63 08/20/2024   MPG 148 05/26/2023   No results found for: PROLACTIN Lab Results  Component Value Date   CHOL 184 08/20/2024   TRIG 75 08/20/2024   HDL 45 08/20/2024   CHOLHDL 4.1 08/20/2024   VLDL 15 08/20/2024   LDLCALC 124 (H) 08/20/2024   LDLCALC 119 (H) 05/26/2023    Physical Findings: AIMS:  , ,  ,  ,    CIWA:    COWS:      Psychiatric Specialty Exam:  Presentation  General Appearance:  Appropriate for Environment  Eye Contact: Fair  Speech: Normal Rate  Speech Volume: Decreased    Mood and Affect  Mood: fine Affect: Appropriate   Thought Process  Thought Processes: Disorganized At baseline Descriptions of Associations: Orientation:Partial  Thought Content:Improving  Hallucinations:denies  Suicidal Thoughts:denies  Homicidal Thoughts:denies   Sensorium  Memory: Immediate Poor; Recent Poor; Remote Poor  Judgment: Impaired  Insight: Shallow   Executive Functions  Concentration: Fair  Attention  Span: Fair  Recall: Poor  Fund of Knowledge: Fair  Language: Fair   Psychomotor Activity  Psychomotor Activity: No data recorded  Musculoskeletal: Strength & Muscle Tone: within normal limits Gait & Station: normal Assets  Assets: Manufacturing systems engineer; Desire for Improvement    Physical Exam: Physical Exam Vitals and nursing note reviewed.    ROS Blood pressure (!) 131/44, pulse 77, temperature 97.9 F (36.6 C), resp. rate 18, height 5' 4 (1.626 m), weight 99.8 kg, SpO2 99%. Body mass index is 37.76 kg/m.  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 Psychiatric Diagnoses and Treatment:  Risperdal  increased 1mg  at bedtime Seroquel  reduced to 100mg  and then discontinued Depakote 250mg  QAM +500mg  QHS  4. Discharge Planning:   -- Social work and case management to assist with discharge planning and identification of hospital follow-up needs prior to discharge  -- Estimated LOS: 3-4 days  Allyn Foil, MD 08/24/2024, 12:01 PM

## 2024-08-24 NOTE — Group Note (Signed)
 Date:  08/24/2024 Time:  11:03 AM  Group Topic/Focus:  Wellness Toolbox:   The focus of this group is to discuss various aspects of wellness, balancing those aspects and exploring ways to increase the ability to experience wellness.  Patients will create a wellness toolbox for use upon discharge.    Participation Level:  Did Not Attend   Pamela Benson 08/24/2024, 11:03 AM

## 2024-08-25 DIAGNOSIS — F22 Delusional disorders: Secondary | ICD-10-CM | POA: Diagnosis not present

## 2024-08-25 DIAGNOSIS — F03918 Unspecified dementia, unspecified severity, with other behavioral disturbance: Secondary | ICD-10-CM | POA: Diagnosis not present

## 2024-08-25 MED ORDER — DIVALPROEX SODIUM 500 MG PO DR TAB: 500.0000 mg | DELAYED_RELEASE_TABLET | Freq: Every evening | ORAL | 0 refills | Status: DC

## 2024-08-25 MED ORDER — DIVALPROEX SODIUM 250 MG PO DR TAB: 250.0000 mg | DELAYED_RELEASE_TABLET | Freq: Every morning | ORAL | 0 refills | Status: DC

## 2024-08-25 MED ORDER — RISPERIDONE 1 MG PO TBDP: 1.0000 mg | ORAL_TABLET | Freq: Every day | ORAL | 0 refills | Status: DC

## 2024-08-25 MED ORDER — TRAZODONE HCL 50 MG PO TABS: 50.0000 mg | ORAL_TABLET | Freq: Every day | ORAL | 0 refills | Status: AC

## 2024-08-25 NOTE — Group Note (Signed)
 Date:  08/25/2024 Time:  11:00 AM  Group Topic/Focus:  Making Healthy Choices:   The focus of this group is to help patients identify negative/unhealthy choices they were using prior to admission and identify positive/healthier coping strategies to replace them upon discharge.    Participation Level:  Active  Participation Quality:  Appropriate  Affect:  Appropriate  Cognitive:  Alert  Insight: Appropriate  Engagement in Group:  Engaged  Modes of Intervention:  Activity  Robbin Escher A Jamarr Treinen 08/25/2024, 11:00 AM

## 2024-08-25 NOTE — Plan of Care (Signed)
  Problem: Coping: Goal: Coping ability will improve Outcome: Progressing   Problem: Self-Concept: Goal: Level of anxiety will decrease Outcome: Progressing   Problem: Coping: Goal: Coping ability will improve Outcome: Progressing

## 2024-08-25 NOTE — Group Note (Signed)
 Date:  08/25/2024 Time:  8:53 PM  Group Topic/Focus:  Healthy Communication:   The focus of this group is to discuss communication, barriers to communication, as well as healthy ways to communicate with others.    Participation Level:  Active  Participation Quality:  Appropriate  Affect:  Appropriate  Cognitive:  Appropriate  Insight: Good  Engagement in Group:  Engaged  Modes of Intervention:  Discussion  Additional Comments:    Pamela Benson 08/25/2024, 8:53 PM

## 2024-08-25 NOTE — Group Note (Signed)
 LCSW Group Therapy Note  Group Date: 08/25/2024 Start Time: 1330 End Time: 1400   Type of Therapy and Topic:  Group Therapy - Healthy vs Unhealthy Coping Skills  Participation Level:  Active   Description of Group The focus of this group was to determine what unhealthy coping techniques typically are used by group members and what healthy coping techniques would be helpful in coping with various problems. Patients were guided in becoming aware of the differences between healthy and unhealthy coping techniques. Patients were asked to identify 2-3 healthy coping skills they would like to learn to use more effectively.  Therapeutic Goals Patients learned that coping is what human beings do all day long to deal with various situations in their lives Patients defined and discussed healthy vs unhealthy coping techniques Patients identified their preferred coping techniques and identified whether these were healthy or unhealthy Patients determined 2-3 healthy coping skills they would like to become more familiar with and use more often. Patients provided support and ideas to each other   Summary of Patient Progress:  During group, Pamela Benson expressed frustration about her relationship with her family . Patient proved open to input from peers and feedback from CSW. Patient demonstrated fair insight into the subject matter, was respectful of peers, and participated throughout the entire session.   Therapeutic Modalities Cognitive Behavioral Therapy Motivational Interviewing  Lum Pamela Benson, [place] 08/25/2024  2:23 PM

## 2024-08-25 NOTE — Progress Notes (Signed)
   08/25/24 1400  Psych Admission Type (Psych Patients Only)  Admission Status Involuntary  Psychosocial Assessment  Patient Complaints None  Eye Contact Fair  Facial Expression Animated  Affect Appropriate to circumstance  Speech Logical/coherent  Interaction Assertive  Motor Activity Slow  Appearance/Hygiene In scrubs  Behavior Characteristics Cooperative  Mood Pleasant  Thought Process  Coherency WDL  Content WDL  Delusions None reported or observed  Perception WDL  Hallucination None reported or observed  Judgment Impaired  Confusion Mild  Danger to Self  Current suicidal ideation? Denies  Agreement Not to Harm Self Yes  Description of Agreement verbal

## 2024-08-25 NOTE — Group Note (Signed)
 Recreation Therapy Group Note   Group Topic:Animal Assisted Therapy   Group Date: 08/25/2024 Start Time: 1435 End Time: 1505 Facilitators: Celestia Jeoffrey BRAVO, LRT, CTRS Location: Dayroom  Group Description: AAA. Animal-Assisted Activity provides opportunities for motivational, educational, therapeutic and/or recreational benefits to enhance quality of life. [name] and Rollo visited the unit to interact with patients.   Goal Areas Addressed:  Reduced anxiety and stress Improved mood Increased social interaction Enhanced communication skills Reduced loneliness and isolation Improved emotional regulation    Affect/Mood: Appropriate   Participation Level: Active and Engaged   Participation Quality: Independent   Behavior: Appropriate, Calm, and Cooperative   Speech/Thought Process: Coherent   Insight: Good   Judgement: Good   Modes of Intervention: Activity   Patient Response to Interventions:  Attentive, Engaged, and Receptive   Education Outcome:  Acknowledges education   Clinical Observations/Individualized Feedback: Pamela Benson was active in their participation of session activities and group discussion. Pt interacted well with LRT and peers duration of session.    Plan: Continue to engage patient in RT group sessions 2-3x/week.   Jeoffrey BRAVO Celestia, LRT, CTRS 08/25/2024 4:46 PM

## 2024-08-25 NOTE — Group Note (Unsigned)
 Date:  08/25/2024 Time:  10:03 AM  Group Topic/Focus:  Goals Group:   The focus of this group is to help patients establish daily goals to achieve during treatment and discuss how the patient can incorporate goal setting into their daily lives to aide in recovery.     Participation Level:  {BHH PARTICIPATION OZCZO:77735}  Participation Quality:  {BHH PARTICIPATION QUALITY:22265}  Affect:  {BHH AFFECT:22266}  Cognitive:  {BHH COGNITIVE:22267}  Insight: {BHH Insight2:20797}  Engagement in Group:  {BHH ENGAGEMENT IN HMNLE:77731}  Modes of Intervention:  {BHH MODES OF INTERVENTION:22269}  Additional Comments:  ***  Deane Wattenbarger A Zylen Wenig 08/25/2024, 10:03 AM

## 2024-08-25 NOTE — Progress Notes (Signed)
 [facility] MD Progress Note  08/25/2024 10:44 AM Pamela Benson  MRN:  [id number] Pamela Benson is a 78 y.o. female with a history of dementia is brought to the [facility] for involuntary commitment due to wandering in her neighborhood , agitation, reporting that she is looking for somebody. Patient is admitted to [facility] unit with Q15 min safety monitoring. Multidisciplinary team approach is offered. Medication management; group/milieu therapy is offered.  CALLED SISTER SUSAN [phone] AND left HIPAA compliant voicemail  Subjective:  Chart reviewed, case discussed in multidisciplinary meeting, patient seen during rounds.  Patient is noted to be sitting in the day area.  She walked with the provider to her room to talk to the provider individually.  She reports that she was able to sleep through the night.  She denies any auditory/visual hallucinations.  She denies suicidal/homicidal ideation.  Provider discussed the discharge planning back to her house.  Patient has chronic delusions about her neighbors and states that she wishes they are not in the house next to her house.  She understands to reach out to her sisters if she feels overwhelmed.  Provider also discussed her for discharge planning and appointments.   Sleep: Fair  Appetite:  Fair  Past Psychiatric History: see h&P Family History: History reviewed. No pertinent family history. Social History:  Social History   Substance and Sexual Activity  Alcohol  Use Not Currently     Social History   Substance and Sexual Activity  Drug Use Never    Social History   Socioeconomic History   Marital status: Widowed    Spouse name: Not on file   Number of children: Not on file   Years of education: Not on file   Highest education level: Not on file  Occupational History   Not on file  Tobacco Use   Smoking status: Never   Smokeless tobacco: Never  Vaping Use   Vaping status: Never Used  Substance and Sexual Activity   Alcohol  use:  Not Currently   Drug use: Never   Sexual activity: Not Currently  Other Topics Concern   Not on file  Social History Narrative   Not on file   Social Drivers of Health   Financial Resource Strain: Medium Risk (10/07/2023)   Received from [facility]   Overall Financial Resource Strain (CARDIA)    Difficulty of Paying Living Expenses: Somewhat hard  Food Insecurity: No Food Insecurity (08/18/2024)   Hunger Vital Sign    Worried About Running Out of Food in the Last Year: Never true    Ran Out of Food in the Last Year: Never true  Transportation Needs: No Transportation Needs (08/18/2024)   PRAPARE - Administrator, Civil Service (Medical): No    Lack of Transportation (Non-Medical): No  Physical Activity: Not on file  Stress: Not on file  Social Connections: Socially Isolated (08/18/2024)   Social Connection and Isolation Panel    Frequency of Communication with Friends and Family: Twice a week    Frequency of Social Gatherings with Friends and Family: Once a week    Attends Religious Services: Never    Database administrator or Organizations: No    Attends Engineer, structural: Patient declined    Marital Status: Widowed   Past Medical History:  Past Medical History:  Diagnosis Date   Diabetes mellitus without complication (HCC)    Osteopenia after menopause    History reviewed. No pertinent surgical history.  Current Medications: Current Facility-Administered Medications  Medication Dose Route Frequency Provider Last Rate Last Admin   acetaminophen  (TYLENOL ) tablet 650 mg  650 mg Oral Q6H PRN Smith, Annie B, NP       alum & mag hydroxide-simeth (MAALOX/MYLANTA) 200-200-20 MG/5ML suspension 30 mL  30 mL Oral Q4H PRN Smith, Annie B, NP       divalproex (DEPAKOTE) DR tablet 250 mg  250 mg Oral q AM Smith, Annie B, NP   250 mg at 08/25/24 0642   divalproex (DEPAKOTE) DR tablet 500 mg  500 mg Oral QPM Smith, Annie B, NP   500 mg at 08/24/24  1748   levothyroxine  (SYNTHROID ) tablet 75 mcg  75 mcg Oral Q0600 Smith, Annie B, NP   75 mcg at 08/25/24 9356   losartan (COZAAR) tablet 25 mg  25 mg Oral Daily Smith, Annie B, NP   25 mg at 08/25/24 0920   magnesium  hydroxide (MILK OF MAGNESIA) suspension 30 mL  30 mL Oral Daily PRN Smith, Annie B, NP       OLANZapine  (ZYPREXA ) injection 5 mg  5 mg Intramuscular TID PRN Smith, Annie B, NP       OLANZapine  zydis (ZYPREXA ) disintegrating tablet 5 mg  5 mg Oral TID PRN Smith, Annie B, NP       risperiDONE  (RISPERDAL  M-TABS) disintegrating tablet 1 mg  1 mg Oral QHS Alesia Oshields, MD   1 mg at 08/24/24 2205   simvastatin (ZOCOR) tablet 20 mg  20 mg Oral QHS Smith, Annie B, NP   20 mg at 08/24/24 2205   traZODone (DESYREL) tablet 50 mg  50 mg Oral QHS Camillo Quadros, MD   50 mg at 08/24/24 2205    Lab Results: No results found for this or any previous visit (from the past 48 hours).  Blood Alcohol  level:  Lab Results  Component Value Date   [facility] <15 08/15/2024   ETH <10 11/06/2023    Metabolic Disorder Labs: Lab Results  Component Value Date   HGBA1C 5.9 (H) 08/20/2024   MPG 122.63 08/20/2024   MPG 148 05/26/2023   No results found for: PROLACTIN Lab Results  Component Value Date   CHOL 184 08/20/2024   TRIG 75 08/20/2024   HDL 45 08/20/2024   CHOLHDL 4.1 08/20/2024   VLDL 15 08/20/2024   LDLCALC 124 (H) 08/20/2024   LDLCALC 119 (H) 05/26/2023    Physical Findings: AIMS:  , ,  ,  ,    CIWA:    COWS:      Psychiatric Specialty Exam:  Presentation  General Appearance:  Appropriate for Environment  Eye Contact: Fair  Speech: Normal Rate  Speech Volume: Decreased    Mood and Affect  Mood: fine Affect: Appropriate   Thought Process  Thought Processes: Disorganized At baseline Descriptions of Associations: Orientation:Partial  Thought Content:Improving  Hallucinations:denies  Suicidal Thoughts:denies  Homicidal Thoughts:denies   Sensorium   Memory: Immediate Poor; Recent Poor; Remote Poor  Judgment: Impaired  Insight: Shallow   Executive Functions  Concentration: Fair  Attention Span: Fair  Recall: Poor  Fund of Knowledge: Fair  Language: Fair   Psychomotor Activity  Psychomotor Activity: No data recorded  Musculoskeletal: Strength & Muscle Tone: within normal limits Gait & Station: normal Assets  Assets: Manufacturing systems engineer; Desire for Improvement    Physical Exam: Physical Exam Vitals and nursing note reviewed.    ROS Blood pressure (!) 116/50, pulse 74, temperature 98.6 F (37 C), resp. rate 18,  height 5' 4 (1.626 m), weight 99.8 kg, SpO2 97%. Body mass index is 37.76 kg/m.  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 Psychiatric Diagnoses and Treatment:  Risperdal   1mg  at bedtime Seroquel  reduced to 100mg  and then discontinued Depakote 250mg  QAM +500mg  QHS  4. Discharge Planning:   -- Social work and case management to assist with discharge planning and identification of hospital follow-up needs prior to discharge  -- Estimated LOS: 3-4 days  Cong Hightower, MD 08/25/2024, 10:44 AM

## 2024-08-25 NOTE — BHH Counselor (Signed)
 CSW contacted pt's sister, [name], sister, [phone]  per provider request to confirm discharge.   Pt's sister agreeable to discharge.   Pt's sister to pick pt up at 1 PM.   CSW informed care team.    Lum Croft, MSW, [facility] 08/25/2024 3:34 PM

## 2024-08-26 ENCOUNTER — Other Ambulatory Visit: Payer: Self-pay

## 2024-08-26 DIAGNOSIS — F22 Delusional disorders: Secondary | ICD-10-CM | POA: Diagnosis not present

## 2024-08-26 NOTE — Progress Notes (Signed)
  [facility] Adult Case Management Discharge Plan :  Will you be returning to the same living situation after discharge:  Yes,  pt will return home  At discharge, do you have transportation home?: Yes,  pt's sister will pick her up  Do you have the ability to pay for your medications: Yes,   [employer] MEDICARE / [facility] MEDICARE  Release of information consent forms completed and in the chart;  Patient's signature needed at discharge.  Patient to Follow up at:  Follow-up Information     [facility].   Why: You have an in-person appointment on 10/26/24 at 9AM with Dr. Izella for medication management.   You have an in-office appointment on 11/23/24 at 1PM with Mareida for therapy. Contact information: [address] [place] [address] [phone]                 Next level of care provider has access to [facility] Link:no  Safety Planning and Suicide Prevention discussed: Yes,  SPE gone over with pt      Has patient been referred to the Quitline?: Patient does not use tobacco/nicotine products  Patient has been referred for addiction treatment: No known substance use disorder.  [address], LCSWA 08/26/2024, 9:51 AM

## 2024-08-26 NOTE — Discharge Summary (Signed)
 Physician Discharge Summary Note  Patient:  Pamela Benson is an 78 y.o., female MRN:  [id number] DOB:  January 23, 1946 Patient phone:  [phone] (home)  Patient address:   [address] [place] [address],   Total time spent: 40 min Date of Admission:  08/18/2024 Date of Discharge: 08/26/24  Reason for Admission:  Pamela Benson is a 78 y.o. female with a history of dementia is brought to the [facility] for involuntary commitment due to wandering in her neighborhood , agitation, reporting that she is looking for somebody. Patient is admitted to [facility] unit with Q15 min safety monitoring. Multidisciplinary team approach is offered. Medication management; group/milieu therapy is offered.   Principal Problem: Delusional disorder [facility]) Discharge Diagnoses: Principal Problem:   Delusional disorder (HCC)   Past Psychiatric History: see h&p  Family Psychiatric  History: see h&p Social History:  Social History   Substance and Sexual Activity  Alcohol  Use Not Currently     Social History   Substance and Sexual Activity  Drug Use Never    Social History   Socioeconomic History   Marital status: Widowed    Spouse name: Not on file   Number of children: Not on file   Years of education: Not on file   Highest education level: Not on file  Occupational History   Not on file  Tobacco Use   Smoking status: Never   Smokeless tobacco: Never  Vaping Use   Vaping status: Never Used  Substance and Sexual Activity   Alcohol  use: Not Currently   Drug use: Never   Sexual activity: Not Currently  Other Topics Concern   Not on file  Social History Narrative   Not on file   Social Drivers of Health   Financial Resource Strain: Medium Risk (10/07/2023)   Received from [facility]   Overall Financial Resource Strain (CARDIA)    Difficulty of Paying Living Expenses: Somewhat hard  Food Insecurity: No Food Insecurity (08/18/2024)   Hunger Vital Sign     Worried About Running Out of Food in the Last Year: Never true    Ran Out of Food in the Last Year: Never true  Transportation Needs: No Transportation Needs (08/18/2024)   PRAPARE - Administrator, Civil Service (Medical): No    Lack of Transportation (Non-Medical): No  Physical Activity: Not on file  Stress: Not on file  Social Connections: Socially Isolated (08/18/2024)   Social Connection and Isolation Panel    Frequency of Communication with Friends and Family: Twice a week    Frequency of Social Gatherings with Friends and Family: Once a week    Attends Religious Services: Never    Database administrator or Organizations: No    Attends Engineer, structural: Patient declined    Marital Status: Widowed   Past Medical History:  Past Medical History:  Diagnosis Date   Diabetes mellitus without complication (HCC)    Osteopenia after menopause    History reviewed. No pertinent surgical history. Family History: History reviewed. No pertinent family history.  Hospital Course:  Pamela Benson is a 78 y.o. female with a history of dementia is brought to the [facility] for involuntary commitment due to wandering in her neighborhood , agitation, reporting that she is looking for somebody. Patient is admitted to [facility] unit with Q15 min safety monitoring. Multidisciplinary team approach is offered. Medication management; group/milieu therapy is offered.  Detailed risk assessment is complete based  on clinical exam and individual risk factors and acute suicide risk is low and acute violence risk is low.    On admission, patient is noted to be on Seroquel  and Risperdal  at night.  To minimize polypharmacy Seroquel  was discontinued as patient was complaining of dizziness and Risperdal  was optimized to 1 mg which helped with the auditory hallucinations.  Patient tolerated medications very well with no problems.  She maintains safe behaviors on the unit.  Her mood improved drastically  during the hospitalization.  On the day of discharge she consistently denied SI/HI/plan and denied hallucinations. Currently, all modifiable risk of harm to self/harm to others have been addressed and patient is no longer appropriate for the acute inpatient setting and is able to continue treatment for mental health needs in the community with the supports as indicated below.  Patient is educated and verbalized understanding of discharge plan of care including medications, follow-up appointments, mental health resources and further crisis services in the community.  He is instructed to call 911 or present to the nearest emergency room should he experience any decompensation in mood, disturbance of bowel or return of suicidal/homicidal ideations.  Patient verbalizes understanding of this education and agrees to this plan of care  Physical Findings: AIMS:  , ,  ,  ,    CIWA:    COWS:        Psychiatric Specialty Exam:  Presentation  General Appearance:  Appropriate for Environment; Casual  Eye Contact: Fair  Speech: Normal Rate  Speech Volume: Decreased    Mood and Affect  Mood: Euthymic  Affect: Appropriate   Thought Process  Thought Processes: Linear  Descriptions of Associations:Intact  Orientation:Partial  Hallucinations:Hallucinations: None  Ideas of Reference:None  Suicidal Thoughts:Suicidal Thoughts: No  Homicidal Thoughts:Homicidal Thoughts: No   Sensorium  Memory: Immediate Fair; Recent Poor; Remote Poor  Judgment: Fair  Insight: -- (chronically limited due to dementia)   Executive Functions  Concentration: Fair  Attention Span: Fair  Recall: Poor  Fund of Knowledge: Fair  Language: Fair   Psychomotor Activity  Psychomotor Activity: Psychomotor Activity: Normal  Musculoskeletal: Strength & Muscle Tone: within normal limits Gait & Station: normal Assets  Assets: Manufacturing systems engineer; Resilience; Physical Health   Sleep   Sleep: Sleep: Fair    Physical Exam: Physical Exam Vitals and nursing note reviewed.    ROS Blood pressure (!) 111/45, pulse 73, temperature 98.1 F (36.7 C), resp. rate 15, height 5' 4 (1.626 m), weight 99.8 kg, SpO2 94%. Body mass index is 37.76 kg/m.   Social History   Tobacco Use  Smoking Status Never  Smokeless Tobacco Never   Tobacco Cessation:  N/A, patient does not currently use tobacco products   Blood Alcohol  level:  Lab Results  Component Value Date   [facility] <15 08/15/2024   ETH <10 11/06/2023    Metabolic Disorder Labs:  Lab Results  Component Value Date   HGBA1C 5.9 (H) 08/20/2024   MPG 122.63 08/20/2024   MPG 148 05/26/2023   No results found for: PROLACTIN Lab Results  Component Value Date   CHOL 184 08/20/2024   TRIG 75 08/20/2024   HDL 45 08/20/2024   CHOLHDL 4.1 08/20/2024   VLDL 15 08/20/2024   LDLCALC 124 (H) 08/20/2024   LDLCALC 119 (H) 05/26/2023    See Psychiatric Specialty Exam and Suicide Risk Assessment completed by Attending Physician prior to discharge.  Discharge destination:  Home  Is patient on multiple antipsychotic therapies at discharge:  No  Has Patient had three or more failed trials of antipsychotic monotherapy by history:  No  Recommended Plan for Multiple Antipsychotic Therapies: NA   Allergies as of 08/26/2024   No Known Allergies      Medication List     STOP taking these medications    diazepam 5 MG tablet Commonly known as: VALIUM   QUEtiapine  200 MG tablet Commonly known as: SEROQUEL    QUEtiapine  400 MG tablet Commonly known as: SEROQUEL    risperiDONE  0.5 MG tablet Commonly known as: RISPERDAL  Replaced by: risperiDONE  1 MG disintegrating tablet       TAKE these medications      Indication  alendronate  70 MG tablet Commonly known as: FOSAMAX  Take 70 mg by mouth once a week.    artificial tears ophthalmic solution Place 1 drop into both eyes as needed for dry eyes.     divalproex 500 MG DR tablet Commonly known as: DEPAKOTE Take 1 tablet (500 mg total) by mouth every evening. What changed:  medication strength how much to take when to take this additional instructions    divalproex 250 MG DR tablet Commonly known as: DEPAKOTE Take 1 tablet (250 mg total) by mouth in the morning. What changed:  medication strength See the new instructions.    estradiol  0.1 MG/GM vaginal cream Commonly known as: ESTRACE  Estrogen Cream Instruction Discard applicator Apply pea sized amount to tip of finger to urethra before bed. Wash hands well after application. Use Monday, Wednesday and Friday    furosemide 20 MG tablet Commonly known as: LASIX Take 20 mg by mouth daily.    levothyroxine  75 MCG tablet Commonly known as: SYNTHROID  Take 1 tablet (75 mcg total) by mouth daily at 6 (six) AM.    losartan 25 MG tablet Commonly known as: COZAAR Take 25 mg by mouth daily.    risperiDONE  1 MG disintegrating tablet Commonly known as: RISPERDAL  M-TABS Take 1 tablet (1 mg total) by mouth at bedtime. Replaces: risperiDONE  0.5 MG tablet    simvastatin 20 MG tablet Commonly known as: ZOCOR Take 20 mg by mouth at bedtime.    traZODone 50 MG tablet Commonly known as: DESYREL Take 1 tablet (50 mg total) by mouth at bedtime.         Follow-up Information     [facility].   Why: You have an in-person appointment on 10/26/24 at 9AM with Dr. Izella for medication management.   You have an in-office appointment on 11/23/24 at 1PM with Mareida for therapy. Contact information: [address] [place] [address] [phone]                 Follow-up recommendations:  Activity:  As tolerated    Signed: Monike Bragdon, MD 08/26/2024, 9:39 AM

## 2024-08-26 NOTE — Progress Notes (Signed)
 Patient was discharged from [facility] unit escorted by staff. Patient denies SI/HI/AVH. Discharge packet to include printed AVS, Suicide Risk Assessment, and Transition Record all reviewed with patient. Belongings to include wallet and cell phone returned. Suicide safety plan completed with a copy kept in chart.

## 2024-08-26 NOTE — BHH Suicide Risk Assessment (Signed)
 [facility] Discharge Suicide Risk Assessment   Principal Problem: Delusional disorder [facility]) Discharge Diagnoses: Principal Problem:   Delusional disorder (HCC)   Total Time spent with patient: 30 minutes  Musculoskeletal: Strength & Muscle Tone: within normal limits Gait & Station: normal Patient leans: N/A  Psychiatric Specialty Exam  Presentation  General Appearance:  Appropriate for Environment; Casual  Eye Contact: Fair  Speech: Normal Rate  Speech Volume: Decreased  Handedness: Right   Mood and Affect  Mood: Euthymic  Duration of Depression Symptoms: Greater than two weeks  Affect: Appropriate   Thought Process  Thought Processes: Linear  Descriptions of Associations:Intact  Orientation:Partial  Thought Content:Delusions  History of Schizophrenia/Schizoaffective disorder:No  Duration of Psychotic Symptoms:Greater than six months  Hallucinations:Hallucinations: None  Ideas of Reference:None  Suicidal Thoughts:Suicidal Thoughts: No  Homicidal Thoughts:Homicidal Thoughts: No   Sensorium  Memory: Immediate Fair; Recent Poor; Remote Poor  Judgment: Fair  Insight: -- (chronically limited due to dementia)   Executive Functions  Concentration: Fair  Attention Span: Fair  Recall: Poor  Fund of Knowledge: Fair  Language: Fair   Psychomotor Activity  Psychomotor Activity: Psychomotor Activity: Normal   Assets  Assets: Manufacturing systems engineer; Resilience; Physical Health   Sleep  Sleep: Sleep: Fair  Estimated Sleeping Duration (Last 24 Hours): 7.75-9.50 hours  Physical Exam: Physical Exam ROS Blood pressure (!) 111/45, pulse 73, temperature 98.1 F (36.7 C), resp. rate 15, height 5' 4 (1.626 m), weight 99.8 kg, SpO2 94%. Body mass index is 37.76 kg/m.  Mental Status Per Nursing Assessment::   On Admission:  NA  Demographic Factors:  Caucasian  Loss Factors: Decrease in vocational status  Historical  Factors: Impulsivity  Risk Reduction Factors:   Positive social support, Positive therapeutic relationship, and Positive coping skills or problem solving skills  Continued Clinical Symptoms:  Depression:   Impulsivity  Cognitive Features That Contribute To Risk:  None    Suicide Risk:  Minimal: No identifiable suicidal ideation.  Patients presenting with no risk factors but with morbid ruminations; may be classified as minimal risk based on the severity of the depressive symptoms   Follow-up Information     [facility].   Why: You have an in-person appointment on 10/26/24 at 9AM with Dr. Izella for medication management.   You have an in-office appointment on 11/23/24 at 1PM with Mareida for therapy. Contact information: [address] [place] [address] [phone]                 Plan Of Care/Follow-up recommendations:  Activity:  As tolerated  Allyn Foil, MD 08/26/2024, 9:39 AM

## 2024-08-26 NOTE — BHH Counselor (Signed)
 CSW contacted Chyrl Misty, pt's nephew per nursing staff request, as nursing staff reports that Chyrl contacted nurses station asking to speak with social worker.   CSW unable to reach, left HIPAA compliant VM requesting return call.   Lum Croft, MSW, [place] 08/26/2024 10:06 AM

## 2024-08-26 NOTE — Progress Notes (Signed)
   08/26/24 0242  Psych Admission Type (Psych Patients Only)  Admission Status Voluntary  Psychosocial Assessment  Patient Complaints None  Eye Contact Fair  Facial Expression Animated  Affect Appropriate to circumstance  Speech Logical/coherent  Interaction Assertive  Motor Activity Slow  Appearance/Hygiene In scrubs  Behavior Characteristics Cooperative  Mood Pleasant  Thought Process  Coherency WDL  Content WDL  Delusions None reported or observed  Perception WDL  Hallucination None reported or observed  Judgment Impaired  Confusion Mild  Danger to Self  Current suicidal ideation? Denies  Description of Suicide Plan denies  Self-Injurious Behavior No self-injurious ideation or behavior indicators observed or expressed   Agreement Not to Harm Self Yes  Description of Agreement verbal

## 2024-08-26 NOTE — Plan of Care (Signed)
  Problem: Coping: Goal: Ability to identify and develop effective coping behavior will improve Outcome: Progressing   Problem: Self-Concept: Goal: Ability to identify factors that promote anxiety will improve Outcome: Progressing   Problem: Coping: Goal: Coping ability will improve Outcome: Progressing   Problem: Education: Goal: Mental status will improve Outcome: Progressing   Problem: Activity: Goal: Sleeping patterns will improve Outcome: Progressing

## 2024-08-26 NOTE — Plan of Care (Addendum)
  Problem: Education: Goal: Ability to state activities that reduce stress will improve Outcome: Completed/Met   Problem: Coping: Goal: Ability to identify and develop effective coping behavior will improve Outcome: Completed/Met   Problem: Self-Concept: Goal: Ability to identify factors that promote anxiety will improve Outcome: Completed/Met Goal: Level of anxiety will decrease Outcome: Completed/Met Goal: Ability to modify response to factors that promote anxiety will improve Outcome: Completed/Met   Problem: Coping: Goal: Coping ability will improve Outcome: Completed/Met   Problem: Safety: Goal: Ability to disclose and discuss suicidal ideas will improve Outcome: Completed/Met Goal: Ability to identify and utilize support systems that promote safety will improve Outcome: Completed/Met   Problem: Self-Concept: Goal: Will verbalize positive feelings about self Outcome: Completed/Met   Problem: Education: Goal: Knowledge of [facility] Education information/materials will improve Outcome: Completed/Met Goal: Emotional status will improve Outcome: Completed/Met Goal: Mental status will improve Outcome: Completed/Met Goal: Verbalization of understanding the information provided will improve Outcome: Completed/Met   Problem: Activity: Goal: Interest or engagement in activities will improve Outcome: Completed/Met Goal: Sleeping patterns will improve Outcome: Completed/Met   Problem: Coping: Goal: Ability to verbalize frustrations and anger appropriately will improve Outcome: Completed/Met Goal: Ability to demonstrate self-control will improve Outcome: Completed/Met   Problem: Health Behavior/Discharge Planning: Goal: Identification of resources available to assist in meeting health care needs will improve Outcome: Completed/Met Goal: Compliance with treatment plan for underlying cause of condition will improve Outcome: Completed/Met   Problem: Physical  Regulation: Goal: Ability to maintain clinical measurements within normal limits will improve Outcome: Completed/Met   Problem: Safety: Goal: Periods of time without injury will increase Outcome: Completed/Met

## 2024-08-26 NOTE — Care Management Important Message (Signed)
 Important Message  Patient Details  Name: Pamela Benson MRN: [id number] Date of Birth: 12-Mar-1946   Important Message Given:  Yes - Medicare IM     Lum JONETTA Croft, LCSWA 08/26/2024, 9:56 AM

## 2024-08-26 NOTE — Group Note (Signed)
 Date:  08/26/2024 Time:  1:28 PM  Group Topic/Focus:  Goals Group:   The focus of this group is to help patients establish daily goals to achieve during treatment and discuss how the patient can incorporate goal setting into their daily lives to aide in recovery.    Participation Level:  Active  Participation Quality:  Appropriate  Affect:  Appropriate  Cognitive:  Alert  Insight: Appropriate  Engagement in Group:  Engaged  Modes of Intervention:  Discussion  Additional Comments:    Shakena Callari l Mivaan Corbitt 08/26/2024, 1:28 PM

## 2024-10-17 NOTE — Progress Notes (Unsigned)
 Psychiatric Initial Adult Assessment  Patient Identification: MARKETTA VALADEZ MRN:  [id number] Date of Evaluation:  10/17/2024  Assessment: Patient presents to the [facility] with her nephew to establish care regarding her symptoms of paranoia, auditory and visual hallucinations.  Since December 2023, patient has been having persistent and pervasive persecutory delusions specifically regarding her next-door neighbors along with experiencing auditory hallucinations that her neighbors will kill her along with various types of visual hallucinations including ATVs driving across her yard and her neighbors in her yard.  This has been causing patient much distress making it difficult to function in her daily life as she is having trouble leaving her home and has been constantly calling the cops regarding her neighbors.    Patient continues to believe that these neighbors are going to kill her even with evidence of security cameras that have disproved her beliefs.  Prior to 2023, patient did not have symptoms of psychosis and was not diagnosed with any psychiatric conditions in the past.  Patient has had multiple inpatient psychiatric hospitalizations due to the paranoia and AVH and it appears that in most of the hospitalizations, patient had a UTI.  However, patient continued to have these symptoms even when the UTI would clear.  Patient has also seen a neurologist who has diagnosed her with moderate Alzheimer's dementia.  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  Patient potentially has dementia with Lewy bodies due to having recurrent visual hallucinations that are well-formed and detailed however unclear if she has fluctuating cognition with pronounced variations in attention and alertness, REM sleep behavior disorder, and cardinal features of parkinsonism.  While she does have paranoia and AVH, the onset of her symptoms was when she was  in her mid 8s which is unlikely to be the age of onset for schizophrenia as late life onset tends to be around menopause.  Also on the differential include delusional disorder although the AVH is very prominent in her presentation.   While patient remains capable of doing her ADLs at this time, there is concern that her memory will continue to decline as she was unable to recall details regarding the past few months and is limited regarding her knowledge on what medication she is currently taking at this time.  Her neurologist has recommended for her to transition to an assisted living facility which we did discuss during her appointment today.  Patient also has transportation barriers as she her driving has been revoked by her neurologist and her only mode of transportation is her nephew who travels frequently so she requests to be transitioned to a [facility] outpatient psychiatrist since she would be able to find transportation for that area.  I contacted outpatient psychiatrist to check for availability for this transition and in the meantime we will schedule for follow-up in 6 weeks.  Regarding medications, shared decision making was completed and we discussed increasing Risperdal  to 2 mg to attempt to optimize monotherapy to control symptoms of psychosis.  If agitation continues to be an issue in the future, can consider restarting Depakote  but adherence may be an issue as she has trouble knowing what medication she is on.  She is up-to-date on her antipsychotic monitoring labs.  Will request an EKG from her PCP.  She is also scheduled for therapy which can benefit her obtaining coping strategies for the distressing thoughts.  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 Delusional disorder -  Increase Risperdal  to 2 mg  - Lab monitoring completed 07/2024  - Plan for EKG repeat in upcoming PCP appointment - Plan to see therapy with [facility]  Patient was given contact  information for behavioral health clinic and was instructed to call 911 for emergencies.   Identifying Information: JHANE LORIO is a 78 y.o. female with a history of delusional disorder with inpatient psychiatric hospitalization from 10/2-10/10  due to wandering in her neighborhood, agitation, and reporting that she is looking for somebody who presents in person to [facility] for establishment of care.    Subjective:  History of Present Illness:    Patient seen with her nephew. Patient states that she came to our clinic to get help.Patient's nephew states in December 2023, pt has been calling the police saying she is hearing and seeing things like ATVs driving across the yard and people going in her yard, calling about more than a hundred times. Patient's nephew states pt is concerned about Norleen and Devere whom he believes are not their names. Patient's nephew put up security cameras to see anything and they have been clear. Patient states there is aggravation and stuff that the people next door. Pt states the three neighbors named Devere that are sisters. When asked if she's seen them, she said sorta, I can't describe them to you because I've never actually. She states that they talk to her and make hateful statements. She states one of the Susans say that they will kill her. She states that its probably not true and they ae just being bitches. She states that she's lived in her home since 2003. She reports cleaning and cooking for herself. Patient's neurologist medically revoked in 05/2024.  Regarding her mood, she notes having more happy days than sad days. She notes good sleep, reporting sleeping at 10 AM and waking up in the morning. She notes still finding stuff to do for fun like watching TV. She does not leave her house often because of her neighbors. She notes feeling hopeful for her future. She notes fair energy. She notes okay focus. She notes good appetite, reporting  eating 2 meals days. She denies ever having SI.  Patient denies HI. She currently denies AVH.    Anxiety Symptoms: reports anxiety is all about her neighbors. She states the first thing that her neighbors said to her was I am going to get you and kill you dead  Manic Symptoms: denies  Patient's nephew states Risperdal  and Seroquel  started 1.5 years ago, Depakote  6 months ago. Patient's nephew states that when she was taking the medications, she was calling the police less often. Patient is unsure as to if the medications are helpful for the distress. Patient does not think she is taking Depakote  anymore and feels that she is only on Risperdal .  Patient is A&O x 4.   Chart review: discharged from psych hospital with Risperdal  1mg , Depakote  250 mg qam and 500 mg at bedtime, and PRN trazodone  A1c: 5.9, LDL 124, TSH WNL,  VA level: 42 on 10/2 UDS pos benzos QTC 461 on 08/16/2024 CT head  IMPRESSION: 1. No evidence of acute intracranial abnormality. 2. Chronic microvascular ischemic disease. 3. 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  Past Psychiatric History:  Diagnoses: delusional disorder Previous medications: seroquel  (for sleep) Previous psychiatrist: denies Previous therapist: denies  Hospitalizations: 7x , first time was 09/2023 due to delusions, most recently was 08/2024 for similar issues  Suicide attempts: denies SIB: denies  Hx of violence towards others: denies Hx of trauma/abuse: denies  Substance use:  Alcohol : Denied  Tobacco: Denied Illicit drugs: Denied Prescription drug abuse: Denied Rehab hx: Denied  Past Medical History:  Dx: Moderate dementia with behavioral issues dxed by neurologist Dr. lane PCP: Dr. Glover Aquas neurology and nephrology Denies seizure  Family Psychiatric History: denies  Social History:  Developmental: parents worked in [employer], grew up with siblings Occupational  Hx: Retired, receiving SS; used to do clerical work Support: nephew, sister [employer] Operational Officer Hx: Denied Living Situation: Lives alone in [place] Access to weapons/lethal means: Denied    Past Medical History:  Past Medical History:  Diagnosis Date   Diabetes mellitus without complication (HCC)    Osteopenia after menopause    No past surgical history on file.  Family History: No family history on file.  Social History   Socioeconomic History   Marital status: Widowed    Spouse name: Not on file   Number of children: Not on file   Years of education: Not on file   Highest education level: Not on file  Occupational History   Not on file  Tobacco Use   Smoking status: Never   Smokeless tobacco: Never  Vaping Use   Vaping status: Never Used  Substance and Sexual Activity   Alcohol  use: Not Currently   Drug use: Never   Sexual activity: Not Currently  Other Topics Concern   Not on file  Social History Narrative   Not on file   Social Drivers of Health   Financial Resource Strain: Medium Risk (10/07/2023)   Received from [facility]   Overall Financial Resource Strain (CARDIA)    Difficulty of Paying Living Expenses: Somewhat hard  Food Insecurity: No Food Insecurity (08/18/2024)   Hunger Vital Sign    Worried About Running Out of Food in the Last Year: Never true    Ran Out of Food in the Last Year: Never true  Transportation Needs: No Transportation Needs (08/18/2024)   PRAPARE - Administrator, Civil Service (Medical): No    Lack of Transportation (Non-Medical): No  Physical Activity: Not on file  Stress: Not on file  Social Connections: Socially Isolated (08/18/2024)   Social Connection and Isolation Panel    Frequency of Communication with Friends and Family: Twice a week    Frequency of Social Gatherings with Friends and Family: Once a week    Attends Religious Services: Never    Database Administrator or Organizations: No    Attends Museum/gallery Exhibitions Officer: Patient declined    Marital Status: Widowed    Allergies: No Known Allergies  Current Medications: Current Outpatient Medications  Medication Sig Dispense Refill   alendronate  (FOSAMAX ) 70 MG tablet Take 70 mg by mouth once a week. (Patient not taking: Reported on 08/16/2024)     divalproex  (DEPAKOTE ) 250 MG DR tablet Take 1 tablet (250 mg total) by mouth in the morning. 30 tablet 0   divalproex  (DEPAKOTE ) 500 MG DR tablet Take 1 tablet (500 mg total) by mouth every evening. 30 tablet 0   estradiol  (ESTRACE ) 0.1 MG/GM vaginal cream Estrogen Cream Instruction Discard applicator Apply pea sized amount to tip of finger to urethra before bed. Wash hands well after application. Use Monday, Wednesday and Friday (Patient not taking: Reported on 08/16/2024) 42.5 g 12   furosemide (LASIX) 20 MG tablet Take 20 mg by mouth daily.  levothyroxine  (SYNTHROID ) 75 MCG tablet Take 1 tablet (75 mcg total) by mouth daily at 6 (six) AM. 30 tablet 11   losartan  (COZAAR ) 25 MG tablet Take 25 mg by mouth daily.     polyvinyl alcohol  (LIQUIFILM TEARS) 1.4 % ophthalmic solution Place 1 drop into both eyes as needed for dry eyes. 15 mL 0   risperiDONE  (RISPERDAL  M-TABS) 1 MG disintegrating tablet Take 1 tablet (1 mg total) by mouth at bedtime. 30 tablet 0   simvastatin  (ZOCOR ) 20 MG tablet Take 20 mg by mouth at bedtime.     traZODone  (DESYREL ) 50 MG tablet Take 1 tablet (50 mg total) by mouth at bedtime. 30 tablet 0   No current facility-administered medications for this visit.    Objective:  Psychiatric Specialty Exam General Appearance: appears at stated age, casually dressed and groomed   Behavior: pleasant and cooperative   Psychomotor Activity: no psychomotor agitation or retardation noted   Eye Contact: fair  Speech: normal amount, tone, volume and fluency    Mood: upset  Affect: congruent  Thought Process: linear, goal directed, no circumstantial or tangential thought  process noted, no racing thoughts or flight of ideas  Descriptions of Associations: intact   Thought Content Hallucinations: does not appear responding to stimuli, reports auditory hallucinations of her neighbors  Delusions: paranoia present Suicidal Thoughts: denies SI, intention, plan  Homicidal Thoughts: denies HI, intention, plan   Alertness/Orientation: alert and fully oriented   Insight: limited Judgment: limited  Memory: limited remote memory  Executive Functions  Concentration: intact  Attention Span: fair  Recall: limited Fund of Knowledge: fair   Physical Exam  General: Pleasant, well-appearing. No acute distress. Pulmonary: Normal effort. No wheezing or rales. Skin: No obvious rash or lesions. Neuro: A&Ox3.No focal deficit.  Review of Systems  No reported symptoms  Metabolic Disorder Labs: Lab Results  Component Value Date   HGBA1C 5.9 (H) 08/20/2024   MPG 122.63 08/20/2024   MPG 148 05/26/2023   No results found for: PROLACTIN Lab Results  Component Value Date   CHOL 184 08/20/2024   TRIG 75 08/20/2024   HDL 45 08/20/2024   CHOLHDL 4.1 08/20/2024   VLDL 15 08/20/2024   LDLCALC 124 (H) 08/20/2024   LDLCALC 119 (H) 05/26/2023   Lab Results  Component Value Date   TSH 2.614 08/20/2024    Therapeutic Level Labs: No results found for: LITHIUM No results found for: CBMZ Lab Results  Component Value Date   VALPROATE 42 (L) 08/18/2024    Screenings:  AUDIT    Flowsheet Row Admission (Discharged) from 08/18/2024 in [facility]INE Admission (Discharged) from 07/19/2023 in [facility] BE[facility]AVIORAL MEDICINE Admission (Discharged) from 05/21/2023 in [facility] BE[facility]AVIORAL MEDIC[facility]  Alcohol  Use Disorder Identification Test Final Score (AUDIT) 0 0 0   Flowsheet Row Admission (Discharged) from 08/18/2024 in [facility] BEHAVIORAL MEDICINE ED from 08/15/2024 in [facility] Emergency Department at [facility] ED from  11/06/2023 in [facility] Department at [facility]  C-SSRS RISK CATEGORY No Risk No Risk No Risk    Collaboration of Care: Case discussed with attending, see attending's attestation for additional information.  Ismael Franco, MD PGY-3 Psychiatry Resident

## 2024-10-26 ENCOUNTER — Telehealth (HOSPITAL_COMMUNITY): Payer: Self-pay | Admitting: Psychiatry

## 2024-10-26 ENCOUNTER — Encounter (HOSPITAL_COMMUNITY): Payer: Self-pay | Admitting: Psychiatry

## 2024-10-26 ENCOUNTER — Ambulatory Visit (HOSPITAL_BASED_OUTPATIENT_CLINIC_OR_DEPARTMENT_OTHER): Admitting: Psychiatry

## 2024-10-26 VITALS — BP 145/79 | Wt 210.0 lb

## 2024-10-26 DIAGNOSIS — F03918 Unspecified dementia, unspecified severity, with other behavioral disturbance: Secondary | ICD-10-CM | POA: Insufficient documentation

## 2024-10-26 DIAGNOSIS — F22 Delusional disorders: Secondary | ICD-10-CM | POA: Diagnosis not present

## 2024-10-26 DIAGNOSIS — Z5181 Encounter for therapeutic drug level monitoring: Secondary | ICD-10-CM

## 2024-10-26 MED ORDER — RISPERIDONE 2 MG PO TABS: 2.0000 mg | ORAL_TABLET | Freq: Every day | ORAL | 1 refills | Status: AC

## 2024-10-26 NOTE — Addendum Note (Signed)
 Addended by: CARVIN CROCK on: 10/26/2024 12:02 PM   Modules accepted: Level of Service

## 2024-10-26 NOTE — Patient Instructions (Signed)
 Thank you for attending your appointment today.  -- Please get EKG with your PCP -- increase risperdal  to 2 mg at night -- Continue other medications as prescribed.  Please do not make any changes to medications without first discussing with your provider. If you are experiencing a psychiatric emergency, please call 911 or present to your nearest emergency department. Additional crisis, medication management, and therapy resources are included below.  [facility]  [address], [place], [place] [address] [phone] WALK-IN URGENT CARE 24/7 FOR ANYONE [address] [place], [place]  [phone] Fax: [phone] [email or web address] *Interpreters available *Accepts all insurance and uninsured for Urgent Care needs *Accepts Medicaid and uninsured for outpatient treatment (below)      ONLY FOR [facility]  Below:    Outpatient New Patient Assessment/Therapy Walk-ins:        Monday, Wednesday, and Thursday 8am until slots are full (first come, first served)                   New Patient Psychiatry/Medication Management        Monday-Friday 8am-11am (first come, first served)               For all walk-ins we ask that you arrive by 7:15am, because patients will be seen in the order of arrival.

## 2024-10-26 NOTE — Telephone Encounter (Signed)
 I spoke with patient's nephew Pamela Benson regarding the lack of availability in the [facility] and discussed that I sent a list of resources of OP psychiatrists in the [place] region that he can reach out to via EPIC message. Will keep the scheduled followup appointment in January at this time.

## 2024-10-31 ENCOUNTER — Other Ambulatory Visit (HOSPITAL_COMMUNITY): Payer: Self-pay | Admitting: Psychiatry

## 2024-10-31 DIAGNOSIS — F22 Delusional disorders: Secondary | ICD-10-CM

## 2024-10-31 MED ORDER — DIVALPROEX SODIUM ER 500 MG PO TB24: 500.0000 mg | ORAL_TABLET | Freq: Every day | ORAL | 0 refills | Status: DC

## 2024-10-31 MED ORDER — DIVALPROEX SODIUM 500 MG PO DR TAB: 500.0000 mg | DELAYED_RELEASE_TABLET | Freq: Every day | ORAL | 1 refills | Status: AC

## 2024-10-31 NOTE — Progress Notes (Addendum)
 I spoke with patient's nephew Reyes Misty regarding the patient's medications and the possibility of transferring her care to the [place] region due to transportation barriers.  Unfortunately at this time there is limited availability for follow-up appointments with psychiatrists in that region.  I recommended looking on [email or web address] and also keeping the January appointment for the [facility] clinic for follow-up.  Regarding medications, we will maintain Risperdal  2 mg at this time as we increased the medication 1 week ago.  She previously was on Depakote  to aid with agitation so I discussed restarting the medication at 500 mg to with symptoms of mood stabilization and agitation.  To aid with compliance, we will only restart the nighttime dose (previously was also taking 250 mg in the morning but had difficulty taking her medications) and plan to get a Depakote  level and CMP in her next appointment.    Ismael Franco, MD PGY-3 Psychiatry Resident

## 2024-11-23 ENCOUNTER — Ambulatory Visit (INDEPENDENT_AMBULATORY_CARE_PROVIDER_SITE_OTHER): Admitting: Clinical

## 2024-11-23 ENCOUNTER — Other Ambulatory Visit: Payer: Self-pay | Admitting: Family Medicine

## 2024-11-23 DIAGNOSIS — F22 Delusional disorders: Secondary | ICD-10-CM | POA: Diagnosis not present

## 2024-11-23 DIAGNOSIS — Z1231 Encounter for screening mammogram for malignant neoplasm of breast: Secondary | ICD-10-CM

## 2024-11-26 ENCOUNTER — Encounter (HOSPITAL_COMMUNITY): Payer: Self-pay | Admitting: Clinical

## 2024-11-26 ENCOUNTER — Encounter (HOSPITAL_COMMUNITY): Payer: Self-pay
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
## 2024-11-29 ENCOUNTER — Telehealth (HOSPITAL_COMMUNITY): Payer: Self-pay | Admitting: Clinical

## 2024-12-05 NOTE — Progress Notes (Unsigned)
 " Psychiatric Initial Adult Assessment  Patient Identification: Pamela Benson MRN:  [id number] Date of Evaluation:  12/05/2024  Assessment: Patient presents to the [facility] clinic due to symptoms of paranoia, auditory and visual hallucinations. In the interim, we restarted her nighttime Depakote  dose due to ongoing agitation regarding her neighbors. There is also a concern that patient has been partially compliant on her medication as her nephew states that her remaining pills seems to be more than what is expected if she were to be taking them daily.  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 Delusional disorder - Risperdal  2 mg  - Lab monitoring: CBC, CMP, TSH, A1c on 11/2024, lipid panel on 08/2024  - Plan for EKG repeat in upcoming PCP appointment - Depakote  500 mg daily  - LFTs WNL 11/2024, CBC checked on 11/2024 - platelets WNL - Therapy with Elgie Crest  Patient was given contact information for behavioral health clinic and was instructed to call 911 for emergencies.   Identifying Information: Pamela Benson is a 79 y.o. female with a history of delusional disorder with inpatient psychiatric hospitalization from 10/2-10/10  due to wandering in her neighborhood, agitation, and reporting that she is looking for somebody who presents in person to [facility] for establishment of care.    Subjective:  Interval History:  Patient seen ***.  Patient reports feeling *** today. Since the previous visit, ***. Stressors include ***.   Regarding psychiatric symptoms, ***. Patient reports the medications are ***. Patient reports the following adverse effects: ***.   Patient reports *** sleep, ***. Patient reports *** appetite, ***.   Patient denies current SI, HI, and AVH. ***  Chart review: discharged from psych hospital with Risperdal  1mg , Depakote  250 mg qam and 500 mg at bedtime, and PRN trazodone  A1c: 5.9, LDL 124, TSH  WNL,  VA level: 42 on 10/2 UDS pos benzos QTC 461 on 08/16/2024 CT head  IMPRESSION: 1. No evidence of acute intracranial abnormality. 2. Chronic microvascular ischemic disease. 3. 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  Past Psychiatric History:  Diagnoses: delusional disorder Previous medications: seroquel  (for sleep) Previous psychiatrist: denies Previous therapist: denies  Hospitalizations: 7x , first time was 09/2023 due to delusions, most recently was 08/2024 for similar issues Suicide attempts: denies SIB: denies  Hx of violence towards others: denies Hx of trauma/abuse: denies  Substance use:  Alcohol : Denied  Tobacco: Denied Illicit drugs: Denied Prescription drug abuse: Denied Rehab hx: Denied  Past Medical History:  Dx: Moderate dementia with behavioral issues dxed by neurologist Dr. lane PCP: Dr. Glover Aquas neurology and nephrology Denies seizure  Family Psychiatric History: denies  Social History:  Developmental: parents worked in [employer], grew up with siblings Occupational Hx: Retired, receiving SS; used to do clerical work Support: nephew, sister Armed Forces Operational Officer Hx: Denied Living Situation: Lives alone in [place] Access to weapons/lethal means: Denied    Past Medical History:  Past Medical History:  Diagnosis Date   Diabetes mellitus without complication (HCC)    Osteopenia after menopause    No past surgical history on file.  Family History: No family history on file.  Social History   Socioeconomic History   Marital status: Widowed    Spouse name: Not on file   Number of children: Not on file   Years of education: Not on file   Highest education level: Not on file  Occupational History  Not on file  Tobacco Use   Smoking status: Never   Smokeless tobacco: Never  Vaping Use   Vaping status: Never Used  Substance and Sexual Activity   Alcohol  use: Not Currently   Drug  use: Never   Sexual activity: Not Currently  Other Topics Concern   Not on file  Social History Narrative   Not on file   Social Drivers of Health   Tobacco Use: Low Risk (11/26/2024)   Patient History    Smoking Tobacco Use: Never    Smokeless Tobacco Use: Never    Passive Exposure: Not on file  Financial Resource Strain: Medium Risk (10/07/2023)   Received from [facility]   Overall Financial Resource Strain (CARDIA)    Difficulty of Paying Living Expenses: Somewhat hard  Food Insecurity: No Food Insecurity (08/18/2024)   Epic    Worried About Running Out of Food in the Last Year: Never true    Ran Out of Food in the Last Year: Never true  Transportation Needs: No Transportation Needs (08/18/2024)   Epic    Lack of Transportation (Medical): No    Lack of Transportation (Non-Medical): No  Physical Activity: Not on file  Stress: Not on file  Social Connections: Socially Isolated (08/18/2024)   Social Connection and Isolation Panel    Frequency of Communication with Friends and Family: Twice a week    Frequency of Social Gatherings with Friends and Family: Once a week    Attends Religious Services: Never    Database Administrator or Organizations: No    Attends Engineer, Structural: Patient declined    Marital Status: Widowed  Depression (PHQ2-9): Medium Risk (11/24/2024)   Depression (PHQ2-9)    PHQ-2 Score: 6  Alcohol  Screen: Low Risk (08/18/2024)   Alcohol  Screen    Last Alcohol  Screening Score (AUDIT): 0  Housing: Low Risk  (11/23/2024)   [facility]   Epic    In the last 12 months, was there a time when you were not able to pay the mortgage or rent on time?: No    In the past 12 months, how many times have you moved where you were living?: 0    At any time in the past 12 months, were you homeless or living in a shelter (including now)?: No  Utilities: Not At Risk (08/18/2024)   Epic    Threatened with loss of  utilities: No  Health Literacy: Not on file    Allergies: No Known Allergies  Current Medications: Current Outpatient Medications  Medication Sig Dispense Refill   divalproex  (DEPAKOTE ) 500 MG DR tablet Take 1 tablet (500 mg total) by mouth at bedtime. 30 tablet 1   furosemide (LASIX) 20 MG tablet Take 20 mg by mouth daily.     levothyroxine  (SYNTHROID ) 75 MCG tablet Take 1 tablet (75 mcg total) by mouth daily at 6 (six) AM. 30 tablet 11   losartan  (COZAAR ) 25 MG tablet Take 25 mg by mouth daily.     polyvinyl alcohol  (LIQUIFILM TEARS) 1.4 % ophthalmic solution Place 1 drop into both eyes as needed for dry eyes. 15 mL 0   risperiDONE  (RISPERDAL ) 2 MG tablet Take 1 tablet (2 mg total) by mouth at bedtime. 60 tablet 1   simvastatin  (ZOCOR ) 20 MG tablet Take 20 mg by mouth at bedtime.     traZODone  (DESYREL ) 50 MG tablet Take 1 tablet (50 mg total) by mouth at bedtime. 30 tablet  0   No current facility-administered medications for this visit.    Objective: *** Psychiatric Specialty Exam General Appearance: appears at stated age, casually dressed and groomed   Behavior: pleasant and cooperative   Psychomotor Activity: no psychomotor agitation or retardation noted   Eye Contact: fair  Speech: normal amount, tone, volume and fluency    Mood: upset  Affect: congruent  Thought Process: linear, goal directed, no circumstantial or tangential thought process noted, no racing thoughts or flight of ideas  Descriptions of Associations: intact   Thought Content Hallucinations: does not appear responding to stimuli, reports auditory hallucinations of her neighbors  Delusions: paranoia present Suicidal Thoughts: denies SI, intention, plan  Homicidal Thoughts: denies HI, intention, plan   Alertness/Orientation: alert and fully oriented   Insight: limited Judgment: limited  Memory: limited remote memory  Executive Functions  Concentration: intact  Attention Span: fair  Recall:  limited Fund of Knowledge: fair   Physical Exam  General: Pleasant, well-appearing. No acute distress. Pulmonary: Normal effort. No wheezing or rales. Skin: No obvious rash or lesions. Neuro: A&Ox3.No focal deficit.  Review of Systems  No reported symptoms  Metabolic Disorder Labs: Lab Results  Component Value Date   HGBA1C 5.9 (H) 08/20/2024   MPG 122.63 08/20/2024   MPG 148 05/26/2023   No results found for: PROLACTIN Lab Results  Component Value Date   CHOL 184 08/20/2024   TRIG 75 08/20/2024   HDL 45 08/20/2024   CHOLHDL 4.1 08/20/2024   VLDL 15 08/20/2024   LDLCALC 124 (H) 08/20/2024   LDLCALC 119 (H) 05/26/2023   Lab Results  Component Value Date   TSH 2.614 08/20/2024    Therapeutic Level Labs: No results found for: LITHIUM No results found for: CBMZ Lab Results  Component Value Date   VALPROATE 42 (L) 08/18/2024    Screenings:  AUDIT    Flowsheet Row Admission (Discharged) from 08/18/2024 in [facility]INE Admission (Discharged) from 07/19/2023 in [facility] BE[facility]AVIOR[facility]INE Admission (Discharged) from 05/21/2023 in [facility] BE[facility]IORAL MEDICINE  Alcohol  Use Disorder Identification Test Final Score (AUDIT) 0 0 0   GAD-7    Flowsheet Row Counselor from 11/23/2024 in [facility]  Total GAD-7 Score 7   PHQ2-9    Flowsheet Row Counselor from 11/23/2024 in [facility] at [facility] Total Score 2  PHQ-9 Total Score 6   Flowsheet Row Counselor from 11/23/2024 in [facility] at [facility] Admission (Discharged) from 08/18/2024 in [facility] BEHAVIORAL MEDICINE ED from 08/15/2024 in [facility] Department at [facility]  C-SSRS RISK CATEGORY No Risk No Risk No Risk    Collaboration of Care: Case discussed with attending, see attending's attestation for additional information.  Consent: Patient/Guardian  gives verbal consent for treatment and assignment of benefits for services provided during this visit. Patient/Guardian expressed understanding and agreed to proceed. Patient/Guardian expressed understanding and I answered their questions and concerns.    Ismael Franco, MD PGY-3 Psychiatry Resident  "

## 2024-12-08 NOTE — Progress Notes (Unsigned)
 " Psychiatric Initial Adult Assessment  Patient Identification: Pamela Benson MRN:  [id number] Date of Evaluation:  12/08/2024  Assessment: Patient presents to the [facility] clinic due to symptoms of paranoia, auditory and visual hallucinations. In the interim, we restarted her nighttime Depakote  dose due to ongoing agitation regarding her neighbors. There is also a concern that patient has been partially compliant on her medication as her nephew states that her remaining pills seems to be more than what is expected if she were to be taking them daily.  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 Delusional disorder - Risperdal  2 mg  - Lab monitoring: CBC, CMP, TSH, A1c on 11/2024, lipid panel on 08/2024  - Plan for EKG repeat in upcoming PCP appointment - Depakote  500 mg daily  - LFTs WNL 11/2024, CBC checked on 11/2024 - platelets WNL - Therapy with Elgie Crest  Patient was given contact information for behavioral health clinic and was instructed to call 911 for emergencies.   Identifying Information: Pamela Benson is a 79 y.o. female with a history of delusional disorder with inpatient psychiatric hospitalization from 10/2-10/10  due to wandering in her neighborhood, agitation, and reporting that she is looking for somebody who presents in person to [facility] for establishment of care.    Subjective:  Interval History:  Patient seen ***.  Patient reports feeling *** today. Since the previous visit, ***. Stressors include ***.   Regarding psychiatric symptoms, ***. Patient reports the medications are ***. Patient reports the following adverse effects: ***.   Patient reports *** sleep, ***. Patient reports *** appetite, ***.   Patient denies current SI, HI, and AVH. ***  Chart review: discharged from psych hospital with Risperdal  1mg , Depakote  250 mg qam and 500 mg at bedtime, and PRN trazodone  A1c: 5.9, LDL 124, TSH  WNL,  VA level: 42 on 10/2 UDS pos benzos QTC 461 on 08/16/2024 CT head  IMPRESSION: 1. No evidence of acute intracranial abnormality. 2. Chronic microvascular ischemic disease. 3. 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  Past Psychiatric History:  Diagnoses: delusional disorder Previous medications: seroquel  (for sleep) Previous psychiatrist: denies Previous therapist: denies  Hospitalizations: 7x , first time was 09/2023 due to delusions, most recently was 08/2024 for similar issues Suicide attempts: denies SIB: denies  Hx of violence towards others: denies Hx of trauma/abuse: denies  Substance use:  Alcohol : Denied  Tobacco: Denied Illicit drugs: Denied Prescription drug abuse: Denied Rehab hx: Denied  Past Medical History:  Dx: Moderate dementia with behavioral issues dxed by neurologist Dr. lane PCP: Dr. Glover Aquas neurology and nephrology Denies seizure  Family Psychiatric History: denies  Social History:  Developmental: parents worked in [employer], grew up with siblings Occupational Hx: Retired, receiving SS; used to do clerical work Support: nephew, sister [employer] Oper[employer] Officer Hx: Denied Living Situation: Lives alone in [place] Access to weapons/lethal means: Denied    Past Medical History:  Past Medical History:  Diagnosis Date   Diabetes mellitus without complication (HCC)    Osteopenia after menopause    No past surgical history on file.  Family History: No family history on file.  Social History   Socioeconomic History   Marital status: Widowed    Spouse name: Not on file   Number of children: Not on file   Years of education: Not on file   Highest education level: Not on file  Occupational History  Not on file  Tobacco Use   Smoking status: Never   Smokeless tobacco: Never  Vaping Use   Vaping status: Never Used  Substance and Sexual Activity   Alcohol  use: Not Currently   Drug  use: Never   Sexual activity: Not Currently  Other Topics Concern   Not on file  Social History Narrative   Not on file   Social Drivers of Health   Tobacco Use: Low Risk (11/26/2024)   Patient History    Smoking Tobacco Use: Never    Smokeless Tobacco Use: Never    Passive Exposure: Not on file  Financial Resource Strain: Medium Risk (10/07/2023)   Received from [facility]   Overall Financial Resource Strain (CARDIA)    Difficulty of Paying Living Expenses: Somewhat hard  Food Insecurity: No Food Insecurity (08/18/2024)   Epic    Worried About Running Out of Food in the Last Year: Never true    Ran Out of Food in the Last Year: Never true  Transportation Needs: No Transportation Needs (08/18/2024)   Epic    Lack of Transportation (Medical): No    Lack of Transportation (Non-Medical): No  Physical Activity: Not on file  Stress: Not on file  Social Connections: Socially Isolated (08/18/2024)   Social Connection and Isolation Panel    Frequency of Communication with Friends and Family: Twice a week    Frequency of Social Gatherings with Friends and Family: Once a week    Attends Religious Services: Never    Database Administrator or Organizations: No    Attends Engineer, Structural: Patient declined    Marital Status: Widowed  Depression (PHQ2-9): Medium Risk (11/24/2024)   Depression (PHQ2-9)    PHQ-2 Score: 6  Alcohol  Screen: Low Risk (08/18/2024)   Alcohol  Screen    Last Alcohol  Screening Score (AUDIT): 0  Housing: Low Risk  (11/23/2024)   Received from [facility]   Epic    In the last 12 months, was there a time when you were not able to pay the mortgage or rent on time?: No    In the past 12 months, how many times have you moved where you were living?: 0    At any time in the past 12 months, were you homeless or living in a shelter (including now)?: No  Utilities: Not At Risk (08/18/2024)   Epic    Threatened with loss of  utilities: No  Health Literacy: Not on file    Allergies: No Known Allergies  Current Medications: Current Outpatient Medications  Medication Sig Dispense Refill   divalproex  (DEPAKOTE ) 500 MG DR tablet Take 1 tablet (500 mg total) by mouth at bedtime. 30 tablet 1   furosemide (LASIX) 20 MG tablet Take 20 mg by mouth daily.     levothyroxine  (SYNTHROID ) 75 MCG tablet Take 1 tablet (75 mcg total) by mouth daily at 6 (six) AM. 30 tablet 11   losartan  (COZAAR ) 25 MG tablet Take 25 mg by mouth daily.     polyvinyl alcohol  (LIQUIFILM TEARS) 1.4 % ophthalmic solution Place 1 drop into both eyes as needed for dry eyes. 15 mL 0   risperiDONE  (RISPERDAL ) 2 MG tablet Take 1 tablet (2 mg total) by mouth at bedtime. 60 tablet 1   simvastatin  (ZOCOR ) 20 MG tablet Take 20 mg by mouth at bedtime.     traZODone  (DESYREL ) 50 MG tablet Take 1 tablet (50 mg total) by mouth at bedtime. 30 tablet  0   No current facility-administered medications for this visit.    Objective: *** Psychiatric Specialty Exam General Appearance: appears at stated age, casually dressed and groomed   Behavior: pleasant and cooperative   Psychomotor Activity: no psychomotor agitation or retardation noted   Eye Contact: fair  Speech: normal amount, tone, volume and fluency    Mood: upset  Affect: congruent  Thought Process: linear, goal directed, no circumstantial or tangential thought process noted, no racing thoughts or flight of ideas  Descriptions of Associations: intact   Thought Content Hallucinations: does not appear responding to stimuli, reports auditory hallucinations of her neighbors  Delusions: paranoia present Suicidal Thoughts: denies SI, intention, plan  Homicidal Thoughts: denies HI, intention, plan   Alertness/Orientation: alert and fully oriented   Insight: limited Judgment: limited  Memory: limited remote memory  Executive Functions  Concentration: intact  Attention Span: fair  Recall:  limited Fund of Knowledge: fair   Physical Exam  General: Pleasant, well-appearing. No acute distress. Pulmonary: Normal effort. No wheezing or rales. Skin: No obvious rash or lesions. Neuro: A&Ox3.No focal deficit.  Review of Systems  No reported symptoms  Metabolic Disorder Labs: Lab Results  Component Value Date   HGBA1C 5.9 (H) 08/20/2024   MPG 122.63 08/20/2024   MPG 148 05/26/2023   No results found for: PROLACTIN Lab Results  Component Value Date   CHOL 184 08/20/2024   TRIG 75 08/20/2024   HDL 45 08/20/2024   CHOLHDL 4.1 08/20/2024   VLDL 15 08/20/2024   LDLCALC 124 (H) 08/20/2024   LDLCALC 119 (H) 05/26/2023   Lab Results  Component Value Date   TSH 2.614 08/20/2024    Therapeutic Level Labs: No results found for: LITHIUM No results found for: CBMZ Lab Results  Component Value Date   VALPROATE 42 (L) 08/18/2024    Screenings:  AUDIT    Flowsheet Row Admission (Discharged) from 08/18/2024 in [facility] Admission (Discharged) from 07/19/2023 in [facility] BEHAVIORAL MEDICINE Admission (Discharged) from 05/21/2023 in [facility] BEHAVIORAL MEDICINE  Alcohol  Use Disorder Identification Test Final Score (AUDIT) 0 0 0   GAD-7    Flowsheet Row Counselor from 11/23/2024 in [facility] at [facility]  Total GAD-7 Score 7   PHQ2-9    Flowsheet Row Counselor from 11/23/2024 in [facility] at [facility] Total Score 2  PHQ-9 Total Score 6   Flowsheet Row Counselor from 11/23/2024 in [facility] at [facility] Admission (Discharged) from 08/18/2024 in [facility] BEHAVIORAL MEDICINE ED from 08/15/2024 in [facility] at [facility]  C-SSRS RISK CATEGORY No Risk No Risk No Risk    Collaboration of Care: Case discussed with attending, see attending's attestation for additional information.  Consent: Patient/Guardian  gives verbal consent for treatment and assignment of benefits for services provided during this visit. Patient/Guardian expressed understanding and agreed to proceed. Patient/Guardian expressed understanding and I answered their questions and concerns.    Ismael Franco, MD PGY-3 Psychiatry Resident  "

## 2024-12-08 NOTE — Telephone Encounter (Signed)
 I spoke with patients nephew Chyrl, he said that she has not had it in several months and seems to be sleeping well, so they will wait for the upcoming appointment

## 2024-12-12 ENCOUNTER — Telehealth (HOSPITAL_COMMUNITY): Admitting: Psychiatry

## 2024-12-19 ENCOUNTER — Ambulatory Visit (HOSPITAL_COMMUNITY): Admitting: Psychiatry

## 2025-01-02 ENCOUNTER — Ambulatory Visit (HOSPITAL_COMMUNITY): Admitting: Psychiatry

## 2025-01-19 ENCOUNTER — Ambulatory Visit (HOSPITAL_COMMUNITY): Admitting: Clinical
